# Patient Record
Sex: Female | Born: 1960
Health system: Southern US, Community
[De-identification: ages and names within clinical notes are randomized; demographics above are authoritative.]

## PROBLEM LIST (undated history)

## (undated) DIAGNOSIS — E119 Type 2 diabetes mellitus without complications: Secondary | ICD-10-CM

## (undated) DIAGNOSIS — R7303 Prediabetes: Secondary | ICD-10-CM

## (undated) HISTORY — DX: Type 2 diabetes mellitus without complications: E11.9

---

## 2005-06-19 ENCOUNTER — Ambulatory Visit: Payer: Self-pay

## 2005-06-28 ENCOUNTER — Ambulatory Visit: Payer: Self-pay

## 2005-12-16 ENCOUNTER — Ambulatory Visit: Payer: Self-pay

## 2006-10-22 ENCOUNTER — Ambulatory Visit: Payer: Self-pay

## 2008-01-26 ENCOUNTER — Ambulatory Visit: Payer: Self-pay

## 2008-01-29 ENCOUNTER — Ambulatory Visit: Payer: Self-pay

## 2009-07-25 ENCOUNTER — Ambulatory Visit: Payer: Self-pay

## 2011-03-06 ENCOUNTER — Ambulatory Visit: Payer: Self-pay

## 2012-04-21 ENCOUNTER — Ambulatory Visit: Payer: Self-pay

## 2018-08-14 ENCOUNTER — Encounter (HOSPITAL_COMMUNITY): Payer: Self-pay | Admitting: Emergency Medicine

## 2018-08-14 ENCOUNTER — Emergency Department (HOSPITAL_COMMUNITY)
Admission: EM | Admit: 2018-08-14 | Discharge: 2018-08-15 | Disposition: A | Discharge status: Home / Self Care | Payer: Self-pay | Attending: Emergency Medicine | Admitting: Emergency Medicine

## 2018-08-14 ENCOUNTER — Other Ambulatory Visit: Payer: Self-pay

## 2018-08-14 ENCOUNTER — Emergency Department (HOSPITAL_COMMUNITY): Payer: Self-pay

## 2018-08-14 DIAGNOSIS — N3000 Acute cystitis without hematuria: Secondary | ICD-10-CM | POA: Insufficient documentation

## 2018-08-14 DIAGNOSIS — E1165 Type 2 diabetes mellitus with hyperglycemia: Secondary | ICD-10-CM | POA: Insufficient documentation

## 2018-08-14 DIAGNOSIS — Z79899 Other long term (current) drug therapy: Secondary | ICD-10-CM | POA: Insufficient documentation

## 2018-08-14 DIAGNOSIS — Z7984 Long term (current) use of oral hypoglycemic drugs: Secondary | ICD-10-CM | POA: Insufficient documentation

## 2018-08-14 HISTORY — DX: Prediabetes: R73.03

## 2018-08-14 LAB — BASIC METABOLIC PANEL
Anion gap: 14 (ref 5–15)
BUN: 13 mg/dL (ref 6–20)
CALCIUM: 9.2 mg/dL (ref 8.9–10.3)
CO2: 30 mmol/L (ref 22–32)
Chloride: 84 mmol/L — ABNORMAL LOW (ref 98–111)
Creatinine, Ser: 0.78 mg/dL (ref 0.44–1.00)
GFR calc Af Amer: >60 mL/min (ref >60)
Glucose, Bld: 466 mg/dL — ABNORMAL HIGH (ref 70–99)
Potassium: 4.8 mmol/L (ref 3.5–5.1)
Sodium: 128 mmol/L — ABNORMAL LOW (ref 135–145)

## 2018-08-14 LAB — CBC
HCT: 39.2 % (ref 36.0–46.0)
Hemoglobin: 12.5 g/dL (ref 12.0–15.0)
MCH: 26.7 pg (ref 26.0–34.0)
MCHC: 31.9 g/dL (ref 30.0–36.0)
MCV: 83.8 fL (ref 80.0–100.0)
PLATELETS: 338 10*3/uL (ref 150–400)
RBC: 4.68 MIL/uL (ref 3.87–5.11)
RDW: 13 % (ref 11.5–15.5)
WBC: 16.2 10*3/uL — ABNORMAL HIGH (ref 4.0–10.5)
nRBC: 0 % (ref 0.0–0.2)

## 2018-08-14 LAB — CBG MONITORING, ED
GLUCOSE-CAPILLARY: 456 mg/dL — AB (ref 70–99)
Glucose-Capillary: 395 mg/dL — ABNORMAL HIGH (ref 70–99)

## 2018-08-14 LAB — I-STAT VENOUS BLOOD GAS, ED
ACID-BASE EXCESS: 9 mmol/L — AB (ref 0.0–2.0)
Bicarbonate: 34.5 mmol/L — ABNORMAL HIGH (ref 20.0–28.0)
O2 Saturation: 47 %
TCO2: 36 mmol/L — ABNORMAL HIGH (ref 22–32)
pCO2, Ven: 49 mmHg (ref 44.0–60.0)
pH, Ven: 7.456 — ABNORMAL HIGH (ref 7.250–7.430)
pO2, Ven: 25 mmHg — CL (ref 32.0–45.0)

## 2018-08-14 LAB — LIPASE, BLOOD: Lipase: 51 U/L (ref 11–51)

## 2018-08-14 LAB — MAGNESIUM: Magnesium: 1.9 mg/dL (ref 1.7–2.4)

## 2018-08-14 LAB — HEPATIC FUNCTION PANEL
ALT: 19 U/L (ref 0–44)
AST: 12 U/L — ABNORMAL LOW (ref 15–41)
Albumin: 3 g/dL — ABNORMAL LOW (ref 3.5–5.0)
Alkaline Phosphatase: 121 U/L (ref 38–126)
BILIRUBIN DIRECT: 0.1 mg/dL (ref 0.0–0.2)
Indirect Bilirubin: 0.4 mg/dL (ref 0.3–0.9)
Total Bilirubin: 0.5 mg/dL (ref 0.3–1.2)
Total Protein: 7.5 g/dL (ref 6.5–8.1)

## 2018-08-14 MED ORDER — CEPHALEXIN 500 MG PO CAPS: 500.0000 mg | ORAL_CAPSULE | Freq: Four times a day (QID) | ORAL | 0 refills | Status: AC

## 2018-08-14 MED ORDER — LACTATED RINGERS IV BOLUS
1000.0000 mL | Freq: Once | INTRAVENOUS | Status: AC
  Administered 2018-08-14: 1000 mL via INTRAVENOUS

## 2018-08-14 NOTE — ED Notes (Signed)
Nurse will collect labs. 

## 2018-08-14 NOTE — ED Triage Notes (Signed)
Pt being sent from doctor's office for low K of 2.8, increased WBC count and CBG reading HI. Hx of pre-diabetes. Pt denies any sx or pain.

## 2018-08-14 NOTE — ED Provider Notes (Signed)
[facility] EMERGENCY DEPARTMENT Provider Note   CSN: [id number] Arrival date & time: 08/14/18  1715     History   Chief Complaint Chief Complaint  Patient presents with  . Abnormal Lab  . Hyperglycemia    HPI Nancy Stephenson is a 57 y.o. female.  The history is provided by the patient. The history is limited by a language barrier. A language interpreter was used.   Patient is a 57 year old female with a past medical history of diabetes who presents after being referred to the ED from her medical clinic for further evaluation and management of hyperglycemia and positive nitrates seen on the urine dipstick.  Patient states that she has had some chills and fatigue over the last several days associated with dysuria but denies any fevers, headache, chest pain, shortness of breath, abdominal pain, vomiting, diarrhea, blood in urine, blood in her stool, rash, extremity pain, or other acute complaints.  She notes she has been taking her glipizide and metformin as prescribed by her clinic.  Denies sick contacts or recent travel out of [place].  Denies history of recurrent urinary tract infections.  Denies alleviating or aggravating factors.  Past Medical History:  Diagnosis Date  . Pre-diabetes     There are no active problems to display for this patient.   Past Surgical History:  Procedure Laterality Date  . CESAREAN SECTION       OB History   No obstetric history on file.      Home Medications    Prior to Admission medications   Medication Sig Start Date End Date Taking? Authorizing Provider  diclofenac (VOLTAREN) 50 MG EC tablet Take 50 mg by mouth 2 (two) times daily. 07/31/18 08/15/18 Yes [provider]  glipiZIDE (GLUCOTROL) 5 MG tablet Take 5 mg by mouth 2 (two) times daily before a meal. 08/01/18  Yes [provider]  ibuprofen (ADVIL,MOTRIN) 200 MG tablet Take 200 mg by mouth every 6 (six) hours as needed (for pain or  headaches).   Yes [provider]  metFORMIN (GLUCOPHAGE) 1000 MG tablet Take 1,000 mg by mouth 2 (two) times daily after a meal. 08/03/18  Yes [provider]  cephALEXin (KEFLEX) 500 MG capsule Take 1 capsule (500 mg total) by mouth 4 (four) times daily for 7 days. 08/14/18 08/21/18  Hulan Saas, MD    Family History No family history on file.  Social History Social History   Tobacco Use  . Smoking status: Never Smoker  . Smokeless tobacco: Never Used  Substance Use Topics  . Alcohol use: Never    Frequency: Never  . Drug use: Never     Allergies   Fish allergy and Shellfish-derived products   Review of Systems Review of Systems  Constitutional: Positive for chills and fatigue. Negative for fever.  HENT: Negative for ear pain and sore throat.   Eyes: Negative for pain and visual disturbance.  Respiratory: Negative for cough and shortness of breath.   Cardiovascular: Negative for chest pain and palpitations.  Gastrointestinal: Negative for abdominal pain and vomiting.  Genitourinary: Positive for dysuria. Negative for hematuria.  Musculoskeletal: Negative for arthralgias and back pain.  Skin: Negative for color change and rash.  Neurological: Negative for seizures and syncope.  All other systems reviewed and are negative.    Physical Exam Updated Vital Signs BP 120/81   Pulse 89   Temp 98.1 F (36.7 C) (Oral)   Resp 19   Ht 4' 10"  (  1.473 m)   Wt 52.2 kg   SpO2 96%   BMI 24.04 kg/m   Physical Exam Vitals signs and nursing note reviewed.  Constitutional:      General: She is not in acute distress.    Appearance: Normal appearance. She is well-developed and normal weight.  HENT:     Head: Normocephalic and atraumatic.     Right Ear: External ear normal.     Left Ear: External ear normal.     Nose: Nose normal.     Mouth/Throat:     Mouth: Mucous membranes are moist.  Eyes:     Conjunctiva/sclera: Conjunctivae normal.     Pupils:  Pupils are equal, round, and reactive to light.  Neck:     Musculoskeletal: Neck supple.  Cardiovascular:     Rate and Rhythm: Normal rate and regular rhythm.     Heart sounds: No murmur.  Pulmonary:     Effort: Pulmonary effort is normal. No respiratory distress.     Breath sounds: Normal breath sounds.  Abdominal:     Palpations: Abdomen is soft.     Tenderness: There is no abdominal tenderness.  Skin:    General: Skin is warm and dry.  Neurological:     Mental Status: She is alert.      ED Treatments / Results  Labs (all labs ordered are listed, but only abnormal results are displayed) Labs Reviewed  BASIC METABOLIC PANEL - Abnormal; Notable for the following components:      Result Value   Sodium 128 (*)    Chloride 84 (*)    Glucose, Bld 466 (*)    All other components within normal limits  CBC - Abnormal; Notable for the following components:   WBC 16.2 (*)    All other components within normal limits  HEPATIC FUNCTION PANEL - Abnormal; Notable for the following components:   Albumin 3.0 (*)    AST 12 (*)    All other components within normal limits  CBG MONITORING, ED - Abnormal; Notable for the following components:   Glucose-Capillary 456 (*)    All other components within normal limits  CBG MONITORING, ED - Abnormal; Notable for the following components:   Glucose-Capillary 395 (*)    All other components within normal limits  I-STAT VENOUS BLOOD GAS, ED - Abnormal; Notable for the following components:   pH, Ven 7.456 (*)    pO2, Ven 25.0 (*)    Bicarbonate 34.5 (*)    TCO2 36 (*)    Acid-Base Excess 9.0 (*)    All other components within normal limits  URINE CULTURE  MAGNESIUM  LIPASE, BLOOD  URINALYSIS, ROUTINE W REFLEX MICROSCOPIC  BLOOD GAS, VENOUS    EKG EKG Interpretation  Date/Time:  Friday August 14 2018 22:29:06 EST Ventricular Rate:  89 PR Interval:    QRS Duration: 79 QT Interval:  369 QTC Calculation: 449 R Axis:   24 Text  Interpretation:  Sinus rhythm Ventricular premature complex No old tracing to compare Confirmed by Isla Pence [phone]) on 08/14/2018 10:31:57 PM   Radiology Dg Chest 2 View  Result Date: 08/14/2018 CLINICAL DATA:  Tachycardia EXAM: CHEST - 2 VIEW COMPARISON:  None. FINDINGS: The heart size and mediastinal contours are within normal limits. Both lungs are clear. The visualized skeletal structures are unremarkable. IMPRESSION: No active cardiopulmonary disease. Electronically Signed   By: Donavan Foil M.D.   On: 08/14/2018 22:44    Procedures Procedures (including critical care time)  Medications Ordered in ED Medications  lactated ringers bolus 1,000 mL (0 mLs Intravenous Stopped 08/14/18 2241)     Initial Impression / Assessment and Plan / ED Course  I have reviewed the triage vital signs and the nursing notes.  Pertinent labs & imaging results that were available during my care of the patient were reviewed by me and considered in my medical decision making (see chart for details).     Patient is a 57 year old female who presents with above-stated history and exam.  On presentation patient is tachycardic to 108 with otherwise stable vital signs.  Exam as above remarkable for no abdominal tenderness, no CVA tenderness, and otherwise unremarkable exam.  While patient is noted to be hyperglycemic to 466 I have a low suspicion for DKA given bicarb of 30 and anion gap of 14.  In addition VBG shows a pH of 7.45 with a PCO2 of 49, and bicarb of 34.5.  CBC remarkable for WBC 16.2, hemoglobin 12.5, platelets 338.  Doubt hepatobiliary etiology given patient has no tenderness in right upper quadrant and AST/ALT/alk phos are WNL.  Chest x-ray and EKG obtained due to tachycardia.  Chest x-ray does not suggest any pneumonia, pneumothorax, pulmonary edema, pleural effusion, or other acute intrathoracic abnormality.  EKG shows NSR with ventricular rate of 89, normal intervals, no signs of acute  ischemic change.  I have low suspicion for ACS or PE.  In addition history exam is not consistent with aortic dissection, pyelonephritis, appendicitis, diverticulitis, apparent torsion, PID, or other acute life-threatening intra-abdominal etiology.  Given review of medical record shows the patient had a urine dipstick that was positive for nitrites concern for UTI.Given hyperglycemia and tachycardia there is concern for dehydration.  Patient given IV fluid bolus in the ED. patient's tachycardia resolved after IV fluid bolus.  Doubt acute pancreatitis given lipase of 51.  Patient discharged in stable condition.  Return precautions advised and discussed.  Patient given a prescription to treat her UTI instructed follow-up with her PCP in 2 to 3 days.  Final Clinical Impressions(s) / ED Diagnoses   Final diagnoses:  Acute cystitis without hematuria    ED Discharge Orders         Ordered    cephALEXin (KEFLEX) 500 MG capsule  4 times daily     08/14/18 2349           Hulan Saas, MD 08/15/18 0000    Isla Pence, MD 08/15/18 [phone]

## 2018-08-14 NOTE — ED Notes (Signed)
Patient transported to X-ray 

## 2018-08-15 NOTE — ED Notes (Signed)
Reviewed d/c instructions with pt, who verbalized understanding and had no outstanding questions. Pt departed in NAD, refused use of wheelchair.   

## 2018-09-18 ENCOUNTER — Inpatient Hospital Stay (HOSPITAL_COMMUNITY)
Admission: EM | Admit: 2018-09-18 | Discharge: 2018-09-22 | DRG: 638 | Disposition: A | Discharge status: Home / Self Care | Payer: Self-pay | Attending: Internal Medicine | Admitting: Internal Medicine

## 2018-09-18 ENCOUNTER — Emergency Department (HOSPITAL_COMMUNITY): Payer: Self-pay

## 2018-09-18 ENCOUNTER — Encounter: Payer: Self-pay | Admitting: Family Medicine

## 2018-09-18 ENCOUNTER — Ambulatory Visit: Payer: Self-pay | Attending: Family Medicine | Admitting: Family Medicine

## 2018-09-18 VITALS — BP 145/85 | HR 82 | Temp 98.5°F | Resp 18 | Ht 60.0 in | Wt 110.0 lb

## 2018-09-18 DIAGNOSIS — Z8639 Personal history of other endocrine, nutritional and metabolic disease: Secondary | ICD-10-CM

## 2018-09-18 DIAGNOSIS — E1142 Type 2 diabetes mellitus with diabetic polyneuropathy: Secondary | ICD-10-CM | POA: Insufficient documentation

## 2018-09-18 DIAGNOSIS — R52 Pain, unspecified: Secondary | ICD-10-CM

## 2018-09-18 DIAGNOSIS — B9620 Unspecified Escherichia coli [E. coli] as the cause of diseases classified elsewhere: Secondary | ICD-10-CM | POA: Diagnosis present

## 2018-09-18 DIAGNOSIS — Z8249 Family history of ischemic heart disease and other diseases of the circulatory system: Secondary | ICD-10-CM

## 2018-09-18 DIAGNOSIS — E1165 Type 2 diabetes mellitus with hyperglycemia: Secondary | ICD-10-CM

## 2018-09-18 DIAGNOSIS — Z794 Long term (current) use of insulin: Secondary | ICD-10-CM | POA: Insufficient documentation

## 2018-09-18 DIAGNOSIS — E1110 Type 2 diabetes mellitus with ketoacidosis without coma: Secondary | ICD-10-CM

## 2018-09-18 DIAGNOSIS — N12 Tubulo-interstitial nephritis, not specified as acute or chronic: Secondary | ICD-10-CM | POA: Diagnosis present

## 2018-09-18 DIAGNOSIS — Z1612 Extended spectrum beta lactamase (ESBL) resistance: Secondary | ICD-10-CM | POA: Diagnosis present

## 2018-09-18 DIAGNOSIS — N3001 Acute cystitis with hematuria: Secondary | ICD-10-CM

## 2018-09-18 LAB — POCT URINALYSIS DIP (CLINITEK)
Bilirubin, UA: NEGATIVE
Glucose, UA: 500 mg/dL — AB
Ketones, POC UA: NEGATIVE mg/dL
Nitrite, UA: NEGATIVE
POC PROTEIN,UA: NEGATIVE
Spec Grav, UA: <=1.005 — AB
Urobilinogen, UA: 0.2 U/dL
pH, UA: 6

## 2018-09-18 LAB — URINALYSIS, ROUTINE W REFLEX MICROSCOPIC
Bilirubin Urine: NEGATIVE
Glucose, UA: >=500 mg/dL — AB
Ketones, ur: NEGATIVE mg/dL
Nitrite: NEGATIVE
Protein, ur: NEGATIVE mg/dL
Specific Gravity, Urine: 1.023 (ref 1.005–1.030)
pH: 6 (ref 5.0–8.0)

## 2018-09-18 LAB — BASIC METABOLIC PANEL
Anion gap: 16 — ABNORMAL HIGH (ref 5–15)
BUN: 17 mg/dL (ref 6–20)
CHLORIDE: 87 mmol/L — AB (ref 98–111)
CO2: 25 mmol/L (ref 22–32)
Calcium: 9.2 mg/dL (ref 8.9–10.3)
Creatinine, Ser: 0.7 mg/dL (ref 0.44–1.00)
GFR calc Af Amer: >60 mL/min (ref >60)
GFR calc non Af Amer: >60 mL/min (ref >60)
Glucose, Bld: 603 mg/dL (ref 70–99)
Potassium: 4.3 mmol/L (ref 3.5–5.1)
Sodium: 128 mmol/L — ABNORMAL LOW (ref 135–145)

## 2018-09-18 LAB — POCT GLYCOSYLATED HEMOGLOBIN (HGB A1C): Hemoglobin A1C: 14.9 % — AB (ref 4.0–5.6)

## 2018-09-18 LAB — CBC
HEMATOCRIT: 37.2 % (ref 36.0–46.0)
Hemoglobin: 11.7 g/dL — ABNORMAL LOW (ref 12.0–15.0)
MCH: 26.2 pg (ref 26.0–34.0)
MCHC: 31.5 g/dL (ref 30.0–36.0)
MCV: 83.4 fL (ref 80.0–100.0)
Platelets: 421 10*3/uL — ABNORMAL HIGH (ref 150–400)
RBC: 4.46 MIL/uL (ref 3.87–5.11)
RDW: 13.4 % (ref 11.5–15.5)
WBC: 13.2 10*3/uL — ABNORMAL HIGH (ref 4.0–10.5)
nRBC: 0 % (ref 0.0–0.2)

## 2018-09-18 LAB — CBG MONITORING, ED
GLUCOSE-CAPILLARY: 276 mg/dL — AB (ref 70–99)
Glucose-Capillary: 274 mg/dL — ABNORMAL HIGH (ref 70–99)
Glucose-Capillary: 388 mg/dL — ABNORMAL HIGH (ref 70–99)
Glucose-Capillary: 503 mg/dL (ref 70–99)

## 2018-09-18 LAB — I-STAT BETA HCG BLOOD, ED (MC, WL, AP ONLY): I-stat hCG, quantitative: <5 m[IU]/mL (ref <5)

## 2018-09-18 LAB — GLUCOSE, POCT (MANUAL RESULT ENTRY): POC Glucose: >600 mg/dL (ref 70–99)

## 2018-09-18 MED ORDER — GLUCOSE BLOOD VI STRP: ORAL_STRIP | 12 refills | Status: DC

## 2018-09-18 MED ORDER — SODIUM CHLORIDE 0.9 % IV SOLN
1.0000 g | Freq: Once | INTRAVENOUS | Status: AC
  Administered 2018-09-18: 1 g via INTRAVENOUS
  Filled 2018-09-18: qty 10

## 2018-09-18 MED ORDER — SODIUM CHLORIDE 0.9 % IV BOLUS
1000.0000 mL | Freq: Once | INTRAVENOUS | Status: AC
  Administered 2018-09-18: 1000 mL via INTRAVENOUS

## 2018-09-18 MED ORDER — CIPROFLOXACIN HCL 500 MG PO TABS: 500.0000 mg | ORAL_TABLET | Freq: Two times a day (BID) | ORAL | 0 refills | Status: DC

## 2018-09-18 MED ORDER — INSULIN REGULAR(HUMAN) IN NACL 100-0.9 UT/100ML-% IV SOLN
INTRAVENOUS | Status: DC
  Administered 2018-09-18: 3.3 [IU]/h via INTRAVENOUS
  Filled 2018-09-18: qty 100

## 2018-09-18 MED ORDER — GLIPIZIDE 5 MG PO TABS: 5.0000 mg | ORAL_TABLET | Freq: Two times a day (BID) | ORAL | 0 refills | Status: DC

## 2018-09-18 MED ORDER — MORPHINE SULFATE (PF) 4 MG/ML IV SOLN
4.0000 mg | Freq: Once | INTRAVENOUS | Status: DC
  Filled 2018-09-18: qty 1

## 2018-09-18 MED ORDER — SODIUM CHLORIDE 0.9 % IV SOLN
INTRAVENOUS | Status: DC
  Administered 2018-09-18: 21:00:00 via INTRAVENOUS

## 2018-09-18 MED ORDER — TRUEPLUS LANCETS 28G MISC: 11 refills | Status: DC

## 2018-09-18 MED ORDER — METFORMIN HCL 1000 MG PO TABS: 1000.0000 mg | ORAL_TABLET | Freq: Two times a day (BID) | ORAL | 3 refills | Status: DC

## 2018-09-18 MED ORDER — DEXTROSE-NACL 5-0.45 % IV SOLN: INTRAVENOUS | Status: DC

## 2018-09-18 MED ORDER — IOHEXOL 300 MG/ML  SOLN
100.0000 mL | Freq: Once | INTRAMUSCULAR | Status: AC | PRN
  Administered 2018-09-18: 100 mL via INTRAVENOUS

## 2018-09-18 MED ORDER — TRUE METRIX AIR GLUCOSE METER W/DEVICE KIT: 1.0000 | PACK | Freq: Three times a day (TID) | 0 refills | Status: AC

## 2018-09-18 MED FILL — TRUEplus LANCETS 28G MISC: 30 days supply | Qty: 100 | Fill #0

## 2018-09-18 MED FILL — TRUE METRIX TEST STRIP: 25 days supply | Qty: 100 | Fill #0

## 2018-09-18 MED FILL — !TRUE METRIX BLOOD GLUCOSE: 1 days supply | Qty: 1 | Fill #0

## 2018-09-18 NOTE — Patient Instructions (Addendum)
Please go to the Emergency Department today in follow-up of your dangerously high blood sugar level. A prescription was sent to this pharmacy for you to obtain a glucometer and supplies to check your blood sugars once you have been released from the ED/Hospital. I will refill your current medications to [facility] along with an antibiotic for your urinary tract infection however, you may be prescribed different medications for your blood sugar/diabetes at the hospital. Please return for an appointment here next week.  Hiperglucemia Hyperglycemia La hiperglucemia se produce cuando el nivel de azcar (glucosa) en la sangre es demasiado alto. Puede suceder que no cause sntomas. Si tiene sntomas, estos pueden incluir seales de advertencia, por ejemplo, las siguientes:  Estar ms sediento que lo habitual.  Hambre.  Cansancio.  Necesidad de Geographical information systems officer con mayor frecuencia que lo normal.  Visin borrosa. Si la afeccin empeora, puede presentar otros sntomas, como los siguientes:  [place].  Falta de hambre (inapetencia).  Aliento con [employer] a fruta.  Debilidad.  Aumento o prdida de peso no planificados. Probablemente pierda de peso muy rpidamente.  Hormigueo o adormecimiento en las manos o los pies.  Dolor de [place].  Cuando se pellizca la piel, esta no vuelve rpidamente a su lugar al soltarla (turgencia cutnea deficiente).  Dolor en el vientre (abdomen).  Cortes o moretones que tardan en curarse. La hiperglucemia puede presentarse tanto en personas que tienen diabetes como en las que no la tienen. Puede desarrollarse despacio o rpidamente y ser una emergencia mdica. Siga estas indicaciones en su casa: Instrucciones generales  [employer] de venta libre y los recetados solamente como se lo haya indicado el mdico.  No consuma productos que contengan nicotina o tabaco, como cigarrillos y Administrator, Civil Service. Si necesita ayuda para dejar de fumar, consulte al  mdico.  Limite el consumo de alcohol a no ms de 1 medida por da si es mujer y no est Orthoptist, y a 2 medidas si es hombre. Una medida equivale a 12oz ( ) de cerveza, 5oz ( ) de vino o 1oz (5ml) de bebidas alcohlicas de alta graduacin.  Controle el estrs. Si necesita ayuda para lograrlo, consulte al mdico.  Concurra a todas las visitas de control como se lo haya indicado el mdico. Esto es importante. Comida y bebida   Mantenga un peso saludable.  Haga ejercicios con regularidad como se lo haya indicado el mdico.  Beba la cantidad suficiente de lquido, especialmente si: ? Realiza actividad fsica. ? Se enferma. ? El clima es caluroso.  Consuma alimentos saludables, por ejemplo: ? Protenas con bajo contenido de grasa [place]). ? Carbohidratos complejos, como panes integrales o arroz integral. ? Nils Pyle y verduras frescas. ? Productos lcteos descremados. ? Grasas saludables.  Beba suficiente lquido como para mantener el pis (orina) claro o de color amarillo plido. Si usted tiene diabetes:   Asegrese de Public house manager de la hiperglucemia.  Siga el plan de control de la diabetes como se lo haya indicado el mdico. Haga lo siguiente: ? Aplquese la insulina y tome los medicamentos como se lo hayan indicado. ? Siga el plan de ejercicio. ? Siga el plan de comidas. Coma a horario. No se saltee comidas. ? Contrlese el nivel de azcar en la sangre con la frecuencia que le hayan indicado. Contrlese antes y despus de hacer actividad fsica. Si hace ejercicio durante ms tiempo o de [employer] de lo habitual, asegrese de Chief Operating Officer su nivel de azcar en la sangre con mayor frecuencia. ? Siga el  plan para los [address] de enfermedad cuando no pueda comer o beber normalmente. Arme este plan de antemano con el mdico.  Comparta su plan de control de la diabetes con sus compaeros de trabajo y de la escuela, y con las otras personas que viven en su  hogar.  Contrlese las cetonas en la orina cuando est enfermo y [place] se lo haya indicado el mdico.  Lleve consigo una tarjeta, o use un brazalete o una medalla que indiquen que es diabtico. Comunquese con un mdico si:  El nivel de azcar en la sangre est por encima de 240mg /dl (47,8GNFA/O) durante 2das seguidos.  Tiene problemas para Futures trader de azcar en la sangre dentro del intervalo deseado.  El nivel de azcar en la sangre le aumenta a menudo. Solicite ayuda de inmediato si:  Tiene dificultad para respirar.  Tiene cambios en la manera de sentirse, pensar o actuar (estado mental).  Tiene ganas constantes de vomitar (nuseas).  No puede dejar de vomitar. Estos sntomas pueden Customer service manager. No espere hasta que los sntomas desaparezcan. Solicite atencin mdica de inmediato. Comunquese con el servicio de emergencias de su localidad (911 en los [place]). No conduzca por sus propios medios [employer] hospital. Resumen  La hiperglucemia se produce cuando el nivel de azcar (glucosa) en la sangre es demasiado alto.  La hiperglucemia puede presentarse tanto en personas que tienen diabetes como en las que no la tienen.  Asegrese de beber la cantidad suficiente de lquido, de comer alimentos saludables y de hacer actividad fsica con regularidad.  Comunquese con el mdico si tiene problemas para Futures trader de azcar en la sangre dentro del intervalo deseado. Esta informacin no tiene Theme park manager el consejo del mdico. Asegrese de hacerle al mdico cualquier pregunta que tenga. Document Released: 09/14/2010 Document Revised: 04/08/2017 Document Reviewed: 04/18/2015 Elsevier Interactive Patient Education  2019 [employer].

## 2018-09-18 NOTE — ED Notes (Signed)
Patient transported to [place] 

## 2018-09-18 NOTE — ED Provider Notes (Signed)
[facility] EMERGENCY DEPARTMENT Provider Note   CSN: [id number] Arrival date & time: 09/18/18  1711     History   Chief Complaint Chief Complaint  Patient presents with  . Hyperglycemia    HPI Nancy Stephenson is a 58 y.o. female.  The history is provided by the patient and medical records. The history is limited by a language barrier. A language interpreter was used.  Hyperglycemia     58 year old non-insulin-dependent diabetic female presenting for evaluation of abdominal pain and hyperglycemia.  Patient is Spanish-speaking, history obtained through language interpreter.  Patient reports she was recently diagnosed with diabetes and is currently on metformin and glyburide.  A month ago she was seen by her PCP for abdominal pain and dysuria.  She was diagnosed with having a urinary tract infection and high blood sugar.  She was prescribed Keflex which she took for 7 days.  Her symptoms did improve but for the past several days it has become more uncomfortable.  She complained of burning sensation when urinating with urinary urgency and frequency.  She further complaining of persistent weight loss despite been compliant with her medication.  She does endorse some mild nausea without vomiting.  Complaining of lower abdominal cramping that is moderate in severity and nonradiating.  She does not complain of any fever or chills no headache lightheadedness or dizziness no chest pain or shortness of breath no constipation or diarrhea or rash.  She went to her PCP today with her symptoms but was recommended to come to ER for further care.  Past Medical History:  Diagnosis Date  . Diabetes mellitus without complication ([place])   . Pre-diabetes     Patient Active Problem List   Diagnosis Date Noted  . Type 2 diabetes mellitus with hyperglycemia, without long-term current use of insulin ([place]) 09/18/2018    Past Surgical History:  Procedure Laterality Date  . CESAREAN  SECTION       OB History   No obstetric history on file.      Home Medications    Prior to Admission medications   Medication Sig Start Date End Date Taking? Authorizing Provider  Blood Glucose Monitoring Suppl (TRUE METRIX AIR GLUCOSE METER) w/Device KIT 1 kit by Does not apply route 4 (four) times daily -  before meals and at bedtime. 09/18/18   Fulp, Cammie, MD  ciprofloxacin (CIPRO) 500 MG tablet Take 1 tablet (500 mg total) by mouth 2 (two) times daily. 09/18/18   Fulp, Cammie, MD  glipiZIDE (GLUCOTROL) 5 MG tablet Take 1 tablet (5 mg total) by mouth 2 (two) times daily before a meal. 09/18/18   Fulp, Cammie, MD  glucose blood (TRUE METRIX BLOOD GLUCOSE TEST) test strip Use as instructed to check morning blood sugar before eating, 2 hours after largest meal of the day and before bedtime 09/18/18   Fulp, Cammie, MD  ibuprofen (ADVIL,MOTRIN) 200 MG tablet Take 200 mg by mouth every 6 (six) hours as needed (for pain or headaches).    [provider]  metFORMIN (GLUCOPHAGE) 1000 MG tablet Take 1 tablet (1,000 mg total) by mouth 2 (two) times daily after a meal. 09/18/18   Fulp, Cammie, MD  TRUEPLUS LANCETS 28G MISC Use to check blood sugar 3 times per day 09/18/18   Antony Blackbird, MD    Family History Family History  Problem Relation Age of Onset  . Hypertension Sister   . Hypertension Brother     Social History Social History  Tobacco Use  . Smoking status: Never Smoker  . Smokeless tobacco: Never Used  Substance Use Topics  . Alcohol use: Never    Frequency: Never  . Drug use: Never     Allergies   Fish allergy and Shellfish-derived products   Review of Systems Review of Systems  All other systems reviewed and are negative.    Physical Exam Updated Vital Signs BP (!) 158/99 (BP Location: Right Arm)   Pulse 85   Temp 98 F (36.7 C) (Oral)   Resp 18   SpO2 97%   Physical Exam Vitals signs and nursing note reviewed.  Constitutional:      General: She  is not in acute distress.    Appearance: She is well-developed.  HENT:     Head: Atraumatic.  Eyes:     Conjunctiva/sclera: Conjunctivae normal.  Neck:     Musculoskeletal: Neck supple.  Cardiovascular:     Rate and Rhythm: Normal rate and regular rhythm.  Pulmonary:     Effort: Pulmonary effort is normal.     Breath sounds: Normal breath sounds.  Abdominal:     Palpations: Abdomen is soft.     Tenderness: There is abdominal tenderness (Tenderness to suprapubic region without guarding or rebound tenderness.).  Genitourinary:    Comments: No CVA tenderness Skin:    Findings: No rash.  Neurological:     Mental Status: She is alert.      ED Treatments / Results  Labs (all labs ordered are listed, but only abnormal results are displayed) Labs Reviewed  BASIC METABOLIC PANEL - Abnormal; Notable for the following components:      Result Value   Sodium 128 (*)    Chloride 87 (*)    Glucose, Bld 603 (*)    Anion gap 16 (*)    All other components within normal limits  CBC - Abnormal; Notable for the following components:   WBC 13.2 (*)    Hemoglobin 11.7 (*)    Platelets 421 (*)    All other components within normal limits  URINALYSIS, ROUTINE W REFLEX MICROSCOPIC - Abnormal; Notable for the following components:   Color, Urine STRAW (*)    APPearance HAZY (*)    Glucose, UA >=500 (*)    Hgb urine dipstick SMALL (*)    Leukocytes, UA MODERATE (*)    Bacteria, UA RARE (*)    All other components within normal limits  CBG MONITORING, ED - Abnormal; Notable for the following components:   Glucose-Capillary 503 (*)    All other components within normal limits  CBG MONITORING, ED - Abnormal; Notable for the following components:   Glucose-Capillary 388 (*)    All other components within normal limits  URINE CULTURE  I-STAT BETA HCG BLOOD, ED ([facility], [facility], [facility] ONLY)    EKG None  Radiology Ct Abdomen Pelvis W Contrast  Result Date: 09/18/2018 CLINICAL DATA:  Unspecified  abdominal pain, question pyelonephritis EXAM: CT ABDOMEN AND PELVIS WITH CONTRAST TECHNIQUE: Multidetector CT imaging of the abdomen and pelvis was performed using the standard protocol following bolus administration of intravenous contrast. CONTRAST:  153m OMNIPAQUE IOHEXOL 300 MG/ML  SOLN COMPARISON:  None. FINDINGS: Lower chest: Top-normal heart size. Clear lung bases. Hepatobiliary: Steatosis of the liver without space-occupying mass. Mild fatty infiltration adjacent to the falciform. Nondistended gallbladder free of stones. No biliary dilatation. Pancreas: Normal Spleen: Normal Adrenals/Urinary Tract: Abnormal enhancement of the right kidney with in homogeneous slightly striated appearance compatible with right-sided pyelonephritis. Extrarenal  pelvis is noted on left with normal enhancement of the left kidney. Punctate upper pole renal calculus is seen on the left. No hydroureteronephrosis. Urothelial enhancement the right ureter compatible with urinary tract infection. The urinary bladder is unremarkable. Stomach/Bowel: Decompressed stomach. Normal small bowel rotation. Mild fluid-filled distention without mechanical obstruction involving distal jejunum and ileum. Findings may represent mild small bowel enteritis, dysmotility or ileus among some considerations. Moderate stool retention is seen within the right colon. Normal appendix is identified. Vascular/Lymphatic: Aortic atherosclerosis. No enlarged abdominal or pelvic lymph nodes. Reproductive: Uterus and bilateral adnexa are unremarkable. Other: No abdominal wall hernia or abnormality. No abdominopelvic ascites. Musculoskeletal: Degenerative disc disease L5-S1. No acute nor suspicious osseous abnormalities. IMPRESSION: 1. Findings in keeping with right-sided pyelonephritis. Urothelial enhancement of the right ureter is noted compatible with urinary tract infection as well. There are more confluent areas of hypodensity involving the right kidney in both  upper and lower pole as well as interpolar aspect. A developing lobar nephronia is not excluded. 2. Mild fluid-filled distention of small bowel without mechanical obstruction. Findings may represent a mild small bowel enteritis, dysmotility or ileus among some considerations. 3. Hepatic steatosis. 4. Degenerative disc disease L5-S1. Aortic Atherosclerosis (ICD10-I70.0). Electronically Signed   By: Ashley Royalty M.D.   On: 09/18/2018 21:05    Procedures .Critical Care Performed by: Domenic Moras, PA-C Authorized by: Domenic Moras, PA-C   Critical care provider statement:    Critical care time (minutes):  45   Critical care was time spent personally by me on the following activities:  Discussions with consultants, evaluation of patient's response to treatment, examination of patient, ordering and performing treatments and interventions, ordering and review of laboratory studies, ordering and review of radiographic studies, pulse oximetry, re-evaluation of patient's condition, obtaining history from patient or surrogate and review of old charts   (including critical care time)  Medications Ordered in ED Medications  dextrose 5 %-0.45 % sodium chloride infusion (has no administration in time range)  insulin regular, human (MYXREDLIN) 100 units/ 100 mL infusion (3.3 Units/hr Intravenous New Bag/Given 09/18/18 2149)  sodium chloride 0.9 % bolus 1,000 mL (0 mLs Intravenous Stopped 09/18/18 2127)    And  0.9 %  sodium chloride infusion ( Intravenous New Bag/Given 09/18/18 2128)  morphine 4 MG/ML injection 4 mg (has no administration in time range)  cefTRIAXone (ROCEPHIN) 1 g in sodium chloride 0.9 % 100 mL IVPB (1 g Intravenous New Bag/Given 09/18/18 2129)  iohexol (OMNIPAQUE) 300 MG/ML solution 100 mL (100 mLs Intravenous Contrast Given 09/18/18 2045)     Initial Impression / Assessment and Plan / ED Course  I have reviewed the triage vital signs and the nursing notes.  Pertinent labs & imaging results  that were available during my care of the patient were reviewed by me and considered in my medical decision making (see chart for details).     BP (!) 146/81   Pulse 70   Temp 98 F (36.7 C) (Oral)   Resp 15   SpO2 98%    Final Clinical Impressions(s) / ED Diagnoses   Final diagnoses:  Pyelonephritis of right kidney  Diabetic ketoacidosis without coma associated with type 2 diabetes mellitus [facility])    ED Discharge Orders    None     Patient is complaining of dysuria, suprapubic pain, and uncontrolled CBG.  She is not insulin-dependent  Fortunately patient is nonseptic..  Today, labs shows hyperglycemia with a CBG 603, and an anion gap.  Furthermore,  she does have elevated WBC of 13.2 and a UA consistent with urinary tract infection.  Given potential for pyelonephritis, abdominal pelvis CT scan ordered, IV fluid given as well as initiating glucose stabilizer for better glycemic management.  10:36 PM Patient is currently on a glucose stabilizer to help improve her gap and improve her CBG.  CT scan obtained showing evidence of right pyelonephritis.  With elevated white count of 13, pyelonephritis and patient who failed outpatient treatment for her UTI coupled with concerning for mild DKA, appreciate consultation from triad hospitalist Dr. Posey Pronto who will admit patient for further management.   Domenic Moras, PA-C 09/18/18 2239    Fredia Sorrow, MD 09/20/18 [phone]

## 2018-09-18 NOTE — Progress Notes (Signed)
Patient complains of losing about 20 pounds over the past month and a half and does not know why.

## 2018-09-18 NOTE — H&P (Signed)
History and Physical    Nancy Stephenson LDJ:[id number] DOB: [birth year] DOA: 09/18/2018  PCP: Antony Blackbird, MD  [facility]  I have personally briefly reviewed patient's old medical records in [place]  Chief Complaint: High blood sugar and dysuria  HPI: Nancy Stephenson is a 58 y.o. female with medical history significant for recently diagnosed type 2 diabetes who presents to the ED from her PCP office for hyperglycemia.  Patient is Spanish-speaking therefore communication is via [employer] video remote interpreter.  Further information is obtained from chart review.  [facility] and found to have a elevated point-of-care glucose >600, A1c 14.9, and reported UTI symptoms.  She was sent to the ED for further evaluation and management.  Patient was previously seen in the ED on 08/23/2018 for UTI and treated with Keflex.  She was also noted to be hyperglycemic to 466 without evidence of DKA.  Patient states that she felt well for a period of time after completing antibiotics, but had return of symptoms recently of dysuria, suprapubic discomfort, and intermittent right-sided back pain.  She denies any associated nausea, vomiting, chest pain, palpitations, diaphoresis, lightheadedness or dizziness.  ED Course:  Initial vitals in the ED showed BP 1/99, pulse 85, RR 18, temp 98 Fahrenheit, SPO2 97% on room air.  Labs notable for serum glucose 603, sodium 128 (140 when corrected for hyperglycemia), potassium 4.3, chloride 87, bicarb 25, BUN 17, creatinine 0.7, anion gap 16, WBC 13.2, hemoglobin 11.7, platelets 421, urinalysis showed > 500 glucose, negative ketones, moderate leukocytes, negative nitrites, rare bacteria.  I-STAT beta-hCG is negative.  CT abdomen/pelvis with contrast was obtained which showed finding suggestive of right-sided pyelonephritis.  Urine  culture was collected and patient was started IV ceftriaxone.  She was given 1 L normal saline and started on IV insulin infusion.  The hospital service was consulted admit for further management.   Review of Systems: As per HPI otherwise 10 point review of systems negative.    Past Medical History:  Diagnosis Date  . Diabetes mellitus without complication ([place])   . Pre-diabetes     Past Surgical History:  Procedure Laterality Date  . CESAREAN SECTION       reports that she has never smoked. She has never used smokeless tobacco. She reports that she does not drink alcohol or use drugs.  Allergies  Allergen Reactions  . Fish Allergy Shortness Of Breath, Itching and Swelling  . Shellfish-Derived Products Shortness Of Breath, Itching and Swelling    Family History  Problem Relation Age of Onset  . Hypertension Sister   . Hypertension Brother      Prior to Admission medications   Medication Sig Start Date End Date Taking? Authorizing Provider  glipiZIDE (GLUCOTROL) 5 MG tablet Take 1 tablet (5 mg total) by mouth 2 (two) times daily before a meal. 09/18/18  Yes Fulp, Cammie, MD  ibuprofen (ADVIL,MOTRIN) 200 MG tablet Take 200 mg by mouth every 6 (six) hours as needed (for pain or headaches).   Yes [provider]  metFORMIN (GLUCOPHAGE) 1000 MG tablet Take 1 tablet (1,000 mg total) by mouth 2 (two) times daily after a meal. 09/18/18  Yes Fulp, Cammie, MD  Blood Glucose Monitoring Suppl (TRUE METRIX AIR GLUCOSE METER) w/Device KIT 1 kit by Does not apply route 4 (four) times daily -  before meals and at bedtime. 09/18/18  Fulp, Cammie, MD  ciprofloxacin (CIPRO) 500 MG tablet Take 1 tablet (500 mg total) by mouth 2 (two) times daily. 09/18/18   Fulp, Cammie, MD  glucose blood (TRUE METRIX BLOOD GLUCOSE TEST) test strip Use as instructed to check morning blood sugar before eating, 2 hours after largest meal of the day and before bedtime 09/18/18   Fulp, Cammie, MD  TRUEPLUS  LANCETS 28G MISC Use to check blood sugar 3 times per day 09/18/18   Antony Blackbird, MD    Physical Exam: Vitals:   09/18/18 2330 09/18/18 2345 09/19/18 0015 09/19/18 0030  BP: 132/88 127/70 126/84 121/77  Pulse: 67 68 69 68  Resp: _0 Temp:      TempSrc:      SpO2: 97% 96% 97% 96%    Constitutional: NAD, calm, comfortable, laying supine in bed Eyes: PERRL, lids and conjunctivae normal ENMT: Mucous membranes are moist. Posterior pharynx clear of any exudate or lesions. Normal dentition.  Neck: normal, supple, no masses. Respiratory: clear to auscultation bilaterally, no wheezing, no crackles. Normal respiratory effort. No accessory muscle use.  Cardiovascular: Regular rate and rhythm, no murmurs / rubs / gallops. No extremity edema. 2+ pedal pulses. Abdomen: Mild suprapubic tenderness, no masses palpated.  Right-sided CVA tenderness.  No hepatosplenomegaly. Bowel sounds positive.  Musculoskeletal: no clubbing / cyanosis. No joint deformity upper and lower extremities. Good ROM, no contractures. Normal muscle tone.  Skin: no rashes, lesions, ulcers. No induration Neurologic: CN 2-12 grossly intact. Sensation intact, Strength 5/5 in all 4.  Psychiatric: Normal judgment and insight. Alert and oriented x 3. Normal mood.   Labs on Admission: I have personally reviewed following labs and imaging studies  CBC: Recent Labs  Lab 09/18/18 1802  WBC 13.2*  HGB 11.7*  HCT 37.2  MCV 83.4  PLT 437*   Basic Metabolic Panel: Recent Labs  Lab 09/18/18 1802  NA 128*  K 4.3  CL 87*  CO2 25  GLUCOSE 603*  BUN 17  CREATININE 0.70  CALCIUM 9.2   GFR: Estimated Creatinine Clearance: 55.7 mL/min (by C-G formula based on SCr of 0.7 mg/dL). Liver Function Tests: No results for input(s): AST, ALT, ALKPHOS, BILITOT, PROT, ALBUMIN in the last 168 hours. No results for input(s): LIPASE, AMYLASE in the last 168 hours. No results for input(s): AMMONIA in the last 168 hours. Coagulation  Profile: No results for input(s): INR, PROTIME in the last 168 hours. Cardiac Enzymes: No results for input(s): CKTOTAL, CKMB, CKMBINDEX, TROPONINI in the last 168 hours. BNP (last 3 results) No results for input(s): PROBNP in the last 8760 hours. HbA1C: Recent Labs    09/18/18 1541  HGBA1C 14.9*   CBG: Recent Labs  Lab 09/18/18 1759 09/18/18 2143 09/18/18 2247 09/18/18 2354 09/19/18 0101  GLUCAP 503* 388* 276* 274* 230*   Lipid Profile: No results for input(s): CHOL, HDL, LDLCALC, TRIG, CHOLHDL, LDLDIRECT in the last 72 hours. Thyroid Function Tests: No results for input(s): TSH, T4TOTAL, FREET4, T3FREE, THYROIDAB in the last 72 hours. Anemia Panel: No results for input(s): VITAMINB12, FOLATE, FERRITIN, TIBC, IRON, RETICCTPCT in the last 72 hours. Urine analysis:    Component Value Date/Time   COLORURINE STRAW (A) 09/18/2018 1828   APPEARANCEUR HAZY (A) 09/18/2018 1828   LABSPEC 1.023 09/18/2018 1828   PHURINE 6.0 09/18/2018 1828   GLUCOSEU >=500 (A) 09/18/2018 1828   HGBUR SMALL (A) 09/18/2018 1828   BILIRUBINUR NEGATIVE 09/18/2018 1828   BILIRUBINUR negative 09/18/2018 1538  KETONESUR NEGATIVE 09/18/2018 1828   PROTEINUR NEGATIVE 09/18/2018 1828   UROBILINOGEN 0.2 09/18/2018 1538   NITRITE NEGATIVE 09/18/2018 1828   LEUKOCYTESUR MODERATE (A) 09/18/2018 1828    Radiological Exams on Admission: Ct Abdomen Pelvis W Contrast  Result Date: 09/18/2018 CLINICAL DATA:  Unspecified abdominal pain, question pyelonephritis EXAM: CT ABDOMEN AND PELVIS WITH CONTRAST TECHNIQUE: Multidetector CT imaging of the abdomen and pelvis was performed using the standard protocol following bolus administration of intravenous contrast. CONTRAST:  13m OMNIPAQUE IOHEXOL 300 MG/ML  SOLN COMPARISON:  None. FINDINGS: Lower chest: Top-normal heart size. Clear lung bases. Hepatobiliary: Steatosis of the liver without space-occupying mass. Mild fatty infiltration adjacent to the falciform.  Nondistended gallbladder free of stones. No biliary dilatation. Pancreas: Normal Spleen: Normal Adrenals/Urinary Tract: Abnormal enhancement of the right kidney with in homogeneous slightly striated appearance compatible with right-sided pyelonephritis. Extrarenal pelvis is noted on left with normal enhancement of the left kidney. Punctate upper pole renal calculus is seen on the left. No hydroureteronephrosis. Urothelial enhancement the right ureter compatible with urinary tract infection. The urinary bladder is unremarkable. Stomach/Bowel: Decompressed stomach. Normal small bowel rotation. Mild fluid-filled distention without mechanical obstruction involving distal jejunum and ileum. Findings may represent mild small bowel enteritis, dysmotility or ileus among some considerations. Moderate stool retention is seen within the right colon. Normal appendix is identified. Vascular/Lymphatic: Aortic atherosclerosis. No enlarged abdominal or pelvic lymph nodes. Reproductive: Uterus and bilateral adnexa are unremarkable. Other: No abdominal wall hernia or abnormality. No abdominopelvic ascites. Musculoskeletal: Degenerative disc disease L5-S1. No acute nor suspicious osseous abnormalities. IMPRESSION: 1. Findings in keeping with right-sided pyelonephritis. Urothelial enhancement of the right ureter is noted compatible with urinary tract infection as well. There are more confluent areas of hypodensity involving the right kidney in both upper and lower pole as well as interpolar aspect. A developing lobar nephronia is not excluded. 2. Mild fluid-filled distention of small bowel without mechanical obstruction. Findings may represent a mild small bowel enteritis, dysmotility or ileus among some considerations. 3. Hepatic steatosis. 4. Degenerative disc disease L5-S1. Aortic Atherosclerosis (ICD10-I70.0). Electronically Signed   By: DAshley RoyaltyM.D.   On: 09/18/2018 21:05     Assessment/Plan Principal Problem:    Pyelonephritis Active Problems:   Type 2 diabetes mellitus with hyperglycemia, without long-term [place] use of insulin [place]  SAdja Ruffis a 58y.o. female with medical history significant for recently diagnosed type 2 diabetes who is admitted with right-sided pyelonephritis and hyperglycemia.   Right-sided pyelonephritis: Patient was symptomatic and CT evidence of right-sided pyelonephritis. -Continue IV ceftriaxone -Maintenance IV fluids overnight -Follow-up urine culture -Antiemetics, oral pain meds as needed -Diet as tolerated  Type 2 diabetes with hyperglycemia: Recently diagnosed with A1c 14.9% on 09/18/2018.  He has not been on treatment as an outpatient.  Initially started on insulin infusion without evidence of DKA.  CBG down to 230 and insulin infusion discontinued. -Start Lantus 10 units, sensitive SSI, monitor CBGs   DVT prophylaxis: Lovenox Code Status: Full code, confirmed with patient via video interpreter Family Communication: None present at bedside Disposition Plan: Pending clinical progress Consults called: None Admission status: Observation   VZada FindersMD [facility] Pager [phone] If 7PM-7AM, please contact night-coverage [email or web address]  09/19/2018, 1:11 AM

## 2018-09-18 NOTE — Progress Notes (Signed)
Subjective:    Patient ID: Nancy Stephenson, female    DOB: October 07, 1960, 58 y.o.   MRN: [id number]  Due to a language barrier, patient is accompanied by a live interpreter at today's visit  HPI       58 yo female new to the practice. Patient reports that she was recently diagnosed with diabetes about a month ago and started on medication by mouth for treatment of diabetes.  Patient states that she has been taking the metformin and Glucotrol daily.  Patient does not have a home blood sugar monitor.  Patient reports that she recently was seen in the [facility] urgent care and treated for bladder infection and patient was told that her blood sugars might be elevated secondary to her bladder infection.  Patient states that the urgent care did not make any changes in her medications for the treatment of diabetes. Patient has had increased thirst and some urinary frequency which she thinks may be related to the UTI. She does feel better than she did prior to her ED visit but she does not feel that her symptoms have completely resolved.   Past Medical History:  Diagnosis Date  . Diabetes mellitus without complication ([place])   . Pre-diabetes    Past Surgical History:  Procedure Laterality Date  . CESAREAN SECTION     Family History  Problem Relation Age of Onset  . Hypertension Sister   . Hypertension Brother    Social History   Tobacco Use  . Smoking status: Never Smoker  . Smokeless tobacco: Never Used  Substance Use Topics  . Alcohol use: Never    Frequency: Never  . Drug use: Never   Allergies  Allergen Reactions  . Fish Allergy Shortness Of Breath, Itching and Swelling  . Shellfish-Derived Products Shortness Of Breath, Itching and Swelling     Review of Systems  Constitutional: Positive for fatigue. Negative for chills and fever.  HENT: Negative for congestion, sore throat and trouble swallowing.   Respiratory: Negative for cough and shortness of breath.     Cardiovascular: Negative for chest pain, palpitations and leg swelling.  Gastrointestinal: Negative for abdominal pain, blood in stool, constipation, diarrhea and nausea.  Endocrine: Positive for polydipsia and polyuria. Negative for polyphagia.  Genitourinary: Positive for frequency. Negative for dysuria.  Musculoskeletal: Negative for arthralgias and back pain.  Neurological: Negative for dizziness and headaches.  Hematological: Negative for adenopathy. Does not bruise/bleed easily.       Objective:   Physical Exam Vitals signs and nursing note reviewed.  Constitutional:      General: She is not in acute distress.    Appearance: Normal appearance. She is not ill-appearing, toxic-appearing or diaphoretic.  Neck:     Musculoskeletal: Normal range of motion and neck supple.  Cardiovascular:     Rate and Rhythm: Normal rate and regular rhythm.  Pulmonary:     Effort: Pulmonary effort is normal.     Breath sounds: Normal breath sounds.  Abdominal:     General: Bowel sounds are normal.     Palpations: Abdomen is soft. There is no mass.     Tenderness: There is abdominal tenderness (mild suprapubic discomfort to palp). There is no right CVA tenderness, left CVA tenderness, guarding or rebound.  Musculoskeletal:        General: No swelling or tenderness.     Right lower leg: No edema.     Left lower leg: No edema.  Lymphadenopathy:  Cervical: No cervical adenopathy.  Skin:    General: Skin is warm and dry.  Neurological:     General: No focal deficit present.     Mental Status: She is alert and oriented to person, place, and time.  Psychiatric:        Mood and Affect: Mood normal.        Behavior: Behavior normal.           Assessment & Plan:   1. Type 2 diabetes mellitus with hyperglycemia, without long-term current use of insulin (HCC) Patient's initial blood sugar here in the office was greater than 600.  Patient was told that she would need to go to the emergency  department for further follow-up but patient did not wish to do this initially.  Discussed with the patient that her actual blood sugar could not be determined in the office as it was too high to be read by the glucometer and patient would need to have blood work done in the emergency department to find out her blood sugar and make sure that there were no other electrolyte abnormalities as well as treatment with fluids and insulin to normalize her blood sugars.  Medical assistant also obtain hemoglobin A1c and urinalysis and patient's hemoglobin A1c was elevated at 14.9.  Urinalysis surprisingly did not show the presence of ketones but did have hematuria and leukocytes consistent with continued urinary tract infection.  As patient initially refused emergency department follow-up, she was provided with prescriptions to obtain home blood sugar monitoring supplies including glucometer, strips and lancets as patient states that she did not currently have these items and patient was told that she still needed emergency department follow-up but with the prescriptions would be able to pick up needed supplies status post discharge.  Patient did contact her family who apparently also encourage patient to go to the emergency department for further follow-up and patient did agree by the end of the visit.  Patient was told that she can bring her prescriptions from hospital discharge to this pharmacy or have the medication sent from the hospital to this pharmacy where she would be able to obtain them at a more reasonable cost.  Patient was provided with medications which she was on prior to her visit but I discussed with the patient that these medications would likely change as her A1c indicates that her blood sugar has not been well controlled over the last 90 days with the use of her current medications. Information on hyperglycemia in Spanish given as part of AVS-after visit summary -Glucose (CBG) -Hgb A1c POCT URINALYSIS  DIP (CLINITEK) - Blood Glucose Monitoring Suppl (TRUE METRIX AIR GLUCOSE METER) w/Device KIT; 1 kit by Does not apply route 4 (four) times daily -  before meals and at bedtime.  Dispense: 1 kit; Refill: 0 - TRUEPLUS LANCETS 28G MISC; Use to check blood sugar 3 times per day  Dispense: 100 each; Refill: 11 - metFORMIN (GLUCOPHAGE) 1000 MG tablet; Take 1 tablet (1,000 mg total) by mouth 2 (two) times daily after a meal. (Patient not taking: Reported on 10/08/2018)  Dispense: 60 tablet; Refill: 3 - glipiZIDE (GLUCOTROL) 5 MG tablet; Take 1 tablet (5 mg total) by mouth 2 (two) times daily before a meal. (Patient not taking: Reported on 10/08/2018)  Dispense: 60 tablet; Refill: 0  2. Acute cystitis with hematuria Patient is status post emergency department visit on 08/14/2018 at which time patient was found to have acute cystitis without hematuria at that time. Patient  with positive nitrites on UA but also with glucose of 466 at ED and WBC of 16.2. Per ED  notes patient also had tachycardia initially and was given IV fluid bolus with resolution of tachycardia. No change made in her diabetes medications and patient was discharged to home on Keflex. Patient still appears to have UTI. Patient was encouraged to go to the ED for further evaluation as her hyperglycemia may be due to her UTI or urosepsis despite the fact that patient does not appear acutely ill here in the office.   An After Visit Summary was printed and given to the patient.  Return in about 4 days (around 09/22/2018) for DM- go to the ED; f/u here next week.

## 2018-09-18 NOTE — ED Triage Notes (Signed)
Patient states she was sent to ED after having a high CBG - had her blood glucose checked at internal medicine today. Denies history of diabetes, states she was eating right before having it checked. Denies polyuria, polydipsia.

## 2018-09-19 ENCOUNTER — Other Ambulatory Visit: Payer: Self-pay

## 2018-09-19 ENCOUNTER — Encounter (HOSPITAL_COMMUNITY): Payer: Self-pay | Admitting: *Deleted

## 2018-09-19 LAB — BASIC METABOLIC PANEL
Anion gap: 9 (ref 5–15)
BUN: 13 mg/dL (ref 6–20)
CO2: 26 mmol/L (ref 22–32)
Calcium: 8.3 mg/dL — ABNORMAL LOW (ref 8.9–10.3)
Chloride: 99 mmol/L (ref 98–111)
Creatinine, Ser: 0.7 mg/dL (ref 0.44–1.00)
GFR calc Af Amer: >60 mL/min (ref >60)
GFR calc non Af Amer: >60 mL/min (ref >60)
Glucose, Bld: 386 mg/dL — ABNORMAL HIGH (ref 70–99)
POTASSIUM: 3.7 mmol/L (ref 3.5–5.1)
Sodium: 134 mmol/L — ABNORMAL LOW (ref 135–145)

## 2018-09-19 LAB — CBC
HCT: 33.2 % — ABNORMAL LOW (ref 36.0–46.0)
Hemoglobin: 10.6 g/dL — ABNORMAL LOW (ref 12.0–15.0)
MCH: 26.5 pg (ref 26.0–34.0)
MCHC: 31.9 g/dL (ref 30.0–36.0)
MCV: 83 fL (ref 80.0–100.0)
Platelets: 366 10*3/uL (ref 150–400)
RBC: 4 MIL/uL (ref 3.87–5.11)
RDW: 13.6 % (ref 11.5–15.5)
WBC: 11.6 10*3/uL — ABNORMAL HIGH (ref 4.0–10.5)
nRBC: 0 % (ref 0.0–0.2)

## 2018-09-19 LAB — HEPATIC FUNCTION PANEL
ALT: 12 U/L (ref 0–44)
AST: 16 U/L (ref 15–41)
Albumin: 2.7 g/dL — ABNORMAL LOW (ref 3.5–5.0)
Alkaline Phosphatase: 73 U/L (ref 38–126)
Bilirubin, Direct: <0.1 mg/dL (ref 0.0–0.2)
Total Bilirubin: 0.3 mg/dL (ref 0.3–1.2)
Total Protein: 6 g/dL — ABNORMAL LOW (ref 6.5–8.1)

## 2018-09-19 LAB — GLUCOSE, CAPILLARY
Glucose-Capillary: 410 mg/dL — ABNORMAL HIGH (ref 70–99)
Glucose-Capillary: 122 mg/dL — ABNORMAL HIGH (ref 70–99)
Glucose-Capillary: 199 mg/dL — ABNORMAL HIGH (ref 70–99)
Glucose-Capillary: 252 mg/dL — ABNORMAL HIGH (ref 70–99)

## 2018-09-19 LAB — CBG MONITORING, ED: Glucose-Capillary: 230 mg/dL — ABNORMAL HIGH (ref 70–99)

## 2018-09-19 LAB — HIV ANTIBODY (ROUTINE TESTING W REFLEX): HIV SCREEN 4TH GENERATION: NONREACTIVE

## 2018-09-19 MED ORDER — INSULIN ASPART 100 UNIT/ML ~~LOC~~ SOLN
0.0000 [IU] | Freq: Three times a day (TID) | SUBCUTANEOUS | Status: DC
  Administered 2018-09-19: 3 [IU] via SUBCUTANEOUS
  Administered 2018-09-19: 2 [IU] via SUBCUTANEOUS
  Administered 2018-09-20 (×2): 11 [IU] via SUBCUTANEOUS
  Administered 2018-09-20 – 2018-09-21 (×2): 8 [IU] via SUBCUTANEOUS
  Administered 2018-09-21: 11 [IU] via SUBCUTANEOUS
  Administered 2018-09-21 – 2018-09-22 (×2): 3 [IU] via SUBCUTANEOUS
  Administered 2018-09-22: 5 [IU] via SUBCUTANEOUS

## 2018-09-19 MED ORDER — INSULIN ASPART 100 UNIT/ML ~~LOC~~ SOLN: 0.0000 [IU] | Freq: Three times a day (TID) | SUBCUTANEOUS | Status: DC

## 2018-09-19 MED ORDER — ACETAMINOPHEN 325 MG PO TABS: 650.0000 mg | ORAL_TABLET | Freq: Four times a day (QID) | ORAL | Status: DC | PRN

## 2018-09-19 MED ORDER — INSULIN GLARGINE 100 UNIT/ML ~~LOC~~ SOLN
10.0000 [IU] | Freq: Two times a day (BID) | SUBCUTANEOUS | Status: DC
  Administered 2018-09-19 – 2018-09-22 (×7): 10 [IU] via SUBCUTANEOUS
  Filled 2018-09-19 (×8): qty 0.1

## 2018-09-19 MED ORDER — FLUCONAZOLE 150 MG PO TABS
150.0000 mg | ORAL_TABLET | Freq: Once | ORAL | Status: AC
  Administered 2018-09-19: 150 mg via ORAL
  Filled 2018-09-19: qty 1

## 2018-09-19 MED ORDER — POTASSIUM CHLORIDE IN NACL 20-0.9 MEQ/L-% IV SOLN
INTRAVENOUS | Status: AC
  Administered 2018-09-19: 03:00:00 via INTRAVENOUS
  Filled 2018-09-19: qty 1000

## 2018-09-19 MED ORDER — ENOXAPARIN SODIUM 40 MG/0.4ML ~~LOC~~ SOLN: 40.0000 mg | SUBCUTANEOUS | Status: DC

## 2018-09-19 MED ORDER — ONDANSETRON HCL 4 MG PO TABS: 4.0000 mg | ORAL_TABLET | Freq: Four times a day (QID) | ORAL | Status: DC | PRN

## 2018-09-19 MED ORDER — INSULIN GLARGINE 100 UNIT/ML ~~LOC~~ SOLN
10.0000 [IU] | Freq: Every day | SUBCUTANEOUS | Status: DC
  Administered 2018-09-19: 10 [IU] via SUBCUTANEOUS
  Filled 2018-09-19: qty 0.1

## 2018-09-19 MED ORDER — INSULIN ASPART 100 UNIT/ML ~~LOC~~ SOLN
12.0000 [IU] | Freq: Once | SUBCUTANEOUS | Status: AC
  Administered 2018-09-19: 12 [IU] via SUBCUTANEOUS

## 2018-09-19 MED ORDER — SODIUM CHLORIDE 0.9 % IV SOLN
1.0000 g | INTRAVENOUS | Status: DC
  Administered 2018-09-20 – 2018-09-21 (×2): 1 g via INTRAVENOUS
  Filled 2018-09-19 (×2): qty 10

## 2018-09-19 MED ORDER — OXYCODONE HCL 5 MG PO TABS: 5.0000 mg | ORAL_TABLET | ORAL | Status: DC | PRN

## 2018-09-19 MED ORDER — METFORMIN HCL 500 MG PO TABS
1000.0000 mg | ORAL_TABLET | Freq: Two times a day (BID) | ORAL | Status: DC
  Administered 2018-09-19 – 2018-09-22 (×6): 1000 mg via ORAL
  Filled 2018-09-19 (×6): qty 2

## 2018-09-19 MED ORDER — ONDANSETRON HCL 4 MG/2ML IJ SOLN: 4.0000 mg | Freq: Four times a day (QID) | INTRAMUSCULAR | Status: DC | PRN

## 2018-09-19 MED ORDER — ACETAMINOPHEN 650 MG RE SUPP: 650.0000 mg | Freq: Four times a day (QID) | RECTAL | Status: DC | PRN

## 2018-09-19 MED ORDER — INSULIN ASPART 100 UNIT/ML ~~LOC~~ SOLN
0.0000 [IU] | Freq: Every day | SUBCUTANEOUS | Status: DC
  Administered 2018-09-19: 2 [IU] via SUBCUTANEOUS
  Administered 2018-09-19: 3 [IU] via SUBCUTANEOUS
  Administered 2018-09-20: 2 [IU] via SUBCUTANEOUS
  Administered 2018-09-21: 3 [IU] via SUBCUTANEOUS

## 2018-09-19 MED ORDER — ENOXAPARIN SODIUM 40 MG/0.4ML ~~LOC~~ SOLN
40.0000 mg | SUBCUTANEOUS | Status: DC
  Administered 2018-09-19 – 2018-09-20 (×2): 40 mg via SUBCUTANEOUS
  Filled 2018-09-19 (×2): qty 0.4

## 2018-09-19 NOTE — Progress Notes (Signed)
New Admission Note:   Arrival Method:  Via stretcher from the ED Mental Orientation:  A & O x 4 Telemetry:   N/A Assessment: Completed Skin:  Intact IV:  See Flowsheet Pain:  Denies Tubes:  None Safety Measures: Safety Fall Prevention Plan has been given, discussed and signed Admission: Completed [address] Orientation: Patient has been orientated to the room, unit and staff.  Family:  None at bedside  Nurse tech on the floor speaks fluent Spanish was able to translate between RN and patient.  Only cell phone at bedside.  All admission questions answered.  Also, all patient's questions were answered.  Orders have been reviewed and implemented. Will continue to monitor the patient. Call light has been placed within reach and bed alarm has been activated.   Bernie Covey RN- BC, [facility] Phone number: [phone]

## 2018-09-19 NOTE — Progress Notes (Signed)
ED RN with another patient.  Will call back with report.  Room is ready.  Bernie Covey RN

## 2018-09-19 NOTE — Progress Notes (Addendum)
Notified MD Ogbata of CBG 410- this CBG was taken after she had eaten breakfast (NT unaware that pt was ACHS). Awaiting response.   [id number] MD called back. MD gave verbal orders for 12 units of insulin aspart NOW, Increase lantus frequency to twice daily and to change sliding scale from low to moderate.   Leonia ReevesNatalie N Cayce Paschal, RN

## 2018-09-19 NOTE — Plan of Care (Signed)
Nutrition Education Note  Given reported wt loss in setting of uncontrolled DM2, RD provided diet education. Utilized translator device-miguel ID [id number]  Lab Results  Component Value Date   HGBA1C 14.9 (A) 09/18/2018   Pt said very little throughout discussion, half of time only giving 1 word responses.   Went through diet recall. She most often will have coffee with a biscuit. She does occassionally skip breakfast. Her lunches and dinners are home cooked. Other than coffee, she only drinks water.   RD reviewed what items had carbohydrates in them. Explained importance of Protein/fiber in balancing out carbohydrates and attenuating glycemic response. Gave examples of how she could balance out her reported breakfast with either a high protein item (eggs) or a high fiber item (oatmeal).   RD reviewed the importance of not skipping meals and the "My Plate" Ratios; she should make 75% of meal pro/veg and 25% carb/starches. We reviewed what the starchy vegetables consisted of. Recommended whole grains.   Pt asked "what can I eat and what can I not eat". RD reiterated that starches, grains, dairy and fruit will all increase her BG. Noted Rice, tortillas, tacos, etc will all increase her BG.Protein/vegetables will be free foods.  RD then reviewed label reading. RD showed picture of a label and told her how to calculate the amount of carbs she eats. Asked her to aim for 45-75 g/meal.   She asked if she needs to take insulin every day to which RD said yes. She seemed depressed. RD tried to encourage her by noting she may be able to get off of insulin eventually by modifying her diet and increasing activity.   Her diet report is suspect, as it does not sound to equate to an A1C of >14. Unable to truly assist with diet management without receiving accurate report.   RD provided spanish DM handouts regarding label reading and carb counting. No further nutrition interventions warranted at this time. If  additional nutrition issues arise, please re-consult RD.  Christophe Louis RD, LDN, CNSC Clinical Nutrition Available Tues-Sat via Pager: [phone] 09/19/2018 7:07 PM

## 2018-09-19 NOTE — Progress Notes (Signed)
PROGRESS NOTE    Nancy Stephenson  MAU:[id number] DOB: September 23, 1960 DOA: 09/18/2018 PCP: Cain Saupe, MD  Outpatient Specialists:    Brief Narrative:  Nancy Stephenson is a 58 y.o. female with medical history significant for recently diagnosed type 2 diabetes who presents to the ED from her PCP office for hyperglycemia.  On presentation, the point-of-care glucose >600, A1c 14.9, and reported UTI symptoms.  Apparently, the patient was seen at the ER on 08/23/2018 for UTI and was treated with Keflex, but the UTI symptoms returned shortly afterwards.  Patient reported suprapubic discomfort, intermittent right-sided back pain.  No associated nausea or vomiting.  On admission, Labs notable for serum glucose 603, sodium 128 (140 when corrected for hyperglycemia), potassium 4.3, chloride 87, bicarb 25, BUN 17, creatinine 0.7, anion gap 16, WBC 13.2, hemoglobin 11.7, platelets 421, urinalysis showed > 500 glucose, negative ketones, moderate leukocytes, negative nitrites, rare bacteria.  I-STAT beta-hCG is negative.  CT abdomen/pelvis with contrast was obtained which showed finding suggestive of right-sided pyelonephritis.  Urine culture was collected and patient was started IV ceftriaxone.  She was given 1 L normal saline and started on IV insulin infusion.  The hospital service was consulted admit for further management.  09/19/2018: Patient continues to report suprapubic discomfort, and query right upper quadrant tenderness.  We will continue IV antibiotics.  We will also get a right upper quadrant ultrasound.  Will check LFTs.  Will optimize blood sugar control.  Further management will depend on hospital course.  Assessment & Plan:   Principal Problem:   Pyelonephritis Active Problems:   Type 2 diabetes mellitus with hyperglycemia, without long-term current use of insulin (HCC)  Possible right-sided pyelonephritis: -Continue IV ceftriaxone -Follow culture results. -Further management  will depend on hospital course.  Right upper quadrant tenderness: -Ultrasound of the right upper quadrant. -Check LFTs. -Further management will depend on above.    Type 2 diabetes with hyperglycemia: -Recently diagnosed with A1c 14.9% on 09/18/2018.   -Continue long-acting insulin as well as sliding scale insulin coverage.   -Start metformin 1000 mg p.o. twice daily.   -Diabetes education.   -Consult diabetes navigator.   -Further management will depend on hospital course.  He has not been on treatment as an outpatient.    DVT prophylaxis: Lovenox Code Status: Full code Family Communication: None present at bedside Disposition Plan:  Likely discharge back home. Consults called: None  Procedures:   Follow the results of right upper quadrant ultrasound  Antimicrobials:   Rocephin IV   Subjective: Vague suprapubic discomfort and right upper quadrant pain/tenderness  Objective: Vitals:   09/19/18 0145 09/19/18 0221 09/19/18 0231 09/19/18 0513  BP: 110/64 135/86  124/80  Pulse: 62 70  71  Resp: 15 16  16   Temp:  97.9 F (36.6 C)  98.7 F (37.1 C)  TempSrc:  Oral  Oral  SpO2: 97% 99%  99%  Weight:   47.9 kg   Height:   5' (1.524 m)     Intake/Output Summary (Last 24 hours) at 09/19/2018 1012 Last data filed at 09/19/2018 0600 Gross per 24 hour  Intake 2623.68 ml  Output 500 ml  Net 2123.68 ml   Filed Weights   09/19/18 0231  Weight: 47.9 kg    Examination:  General exam: Appears calm and comfortable  Respiratory system: Clear to auscultation.  Cardiovascular system: S1 & S2 heard. Gastrointestinal system: Abdomen is nondistended, soft with suprapubic discomfort and right upper quadrant tenderness.  Organs  are difficult to assess.   Central nervous system: Alert and oriented. No focal neurological deficits. Extremities: No leg edema  Data Reviewed: I have personally reviewed following labs and imaging studies  CBC: Recent Labs  Lab 09/18/18 1802  09/19/18 0526  WBC 13.2* 11.6*  HGB 11.7* 10.6*  HCT 37.2 33.2*  MCV 83.4 83.0  PLT 421* 366   Basic Metabolic Panel: Recent Labs  Lab 09/18/18 1802 09/19/18 0526  NA 128* 134*  K 4.3 3.7  CL 87* 99  CO2 25 26  GLUCOSE 603* 386*  BUN 17 13  CREATININE 0.70 0.70  CALCIUM 9.2 8.3*   GFR: Estimated Creatinine Clearance: 55.7 mL/min (by C-G formula based on SCr of 0.7 mg/dL). Liver Function Tests: No results for input(s): AST, ALT, ALKPHOS, BILITOT, PROT, ALBUMIN in the last 168 hours. No results for input(s): LIPASE, AMYLASE in the last 168 hours. No results for input(s): AMMONIA in the last 168 hours. Coagulation Profile: No results for input(s): INR, PROTIME in the last 168 hours. Cardiac Enzymes: No results for input(s): CKTOTAL, CKMB, CKMBINDEX, TROPONINI in the last 168 hours. BNP (last 3 results) No results for input(s): PROBNP in the last 8760 hours. HbA1C: Recent Labs    09/18/18 1541  HGBA1C 14.9*   CBG: Recent Labs  Lab 09/18/18 2143 09/18/18 2247 09/18/18 2354 09/19/18 0101 09/19/18 0834  GLUCAP 388* 276* 274* 230* 410*   Lipid Profile: No results for input(s): CHOL, HDL, LDLCALC, TRIG, CHOLHDL, LDLDIRECT in the last 72 hours. Thyroid Function Tests: No results for input(s): TSH, T4TOTAL, FREET4, T3FREE, THYROIDAB in the last 72 hours. Anemia Panel: No results for input(s): VITAMINB12, FOLATE, FERRITIN, TIBC, IRON, RETICCTPCT in the last 72 hours. Urine analysis:    Component Value Date/Time   COLORURINE STRAW (A) 09/18/2018 1828   APPEARANCEUR HAZY (A) 09/18/2018 1828   LABSPEC 1.023 09/18/2018 1828   PHURINE 6.0 09/18/2018 1828   GLUCOSEU >=500 (A) 09/18/2018 1828   HGBUR SMALL (A) 09/18/2018 1828   BILIRUBINUR NEGATIVE 09/18/2018 1828   BILIRUBINUR negative 09/18/2018 1538   KETONESUR NEGATIVE 09/18/2018 1828   PROTEINUR NEGATIVE 09/18/2018 1828   UROBILINOGEN 0.2 09/18/2018 1538   NITRITE NEGATIVE 09/18/2018 1828   LEUKOCYTESUR  MODERATE (A) 09/18/2018 1828   Sepsis Labs: @LABRCNTIP (procalcitonin:4,lacticidven:4)  )No results found for this or any previous visit (from the past 240 hour(s)).       Radiology Studies: Ct Abdomen Pelvis W Contrast  Result Date: 09/18/2018 CLINICAL DATA:  Unspecified abdominal pain, question pyelonephritis EXAM: CT ABDOMEN AND PELVIS WITH CONTRAST TECHNIQUE: Multidetector CT imaging of the abdomen and pelvis was performed using the standard protocol following bolus administration of intravenous contrast. CONTRAST:  100mL OMNIPAQUE IOHEXOL 300 MG/ML  SOLN COMPARISON:  None. FINDINGS: Lower chest: Top-normal heart size. Clear lung bases. Hepatobiliary: Steatosis of the liver without space-occupying mass. Mild fatty infiltration adjacent to the falciform. Nondistended gallbladder free of stones. No biliary dilatation. Pancreas: Normal Spleen: Normal Adrenals/Urinary Tract: Abnormal enhancement of the right kidney with in homogeneous slightly striated appearance compatible with right-sided pyelonephritis. Extrarenal pelvis is noted on left with normal enhancement of the left kidney. Punctate upper pole renal calculus is seen on the left. No hydroureteronephrosis. Urothelial enhancement the right ureter compatible with urinary tract infection. The urinary bladder is unremarkable. Stomach/Bowel: Decompressed stomach. Normal small bowel rotation. Mild fluid-filled distention without mechanical obstruction involving distal jejunum and ileum. Findings may represent mild small bowel enteritis, dysmotility or ileus among some considerations. Moderate stool retention is  seen within the right colon. Normal appendix is identified. Vascular/Lymphatic: Aortic atherosclerosis. No enlarged abdominal or pelvic lymph nodes. Reproductive: Uterus and bilateral adnexa are unremarkable. Other: No abdominal wall hernia or abnormality. No abdominopelvic ascites. Musculoskeletal: Degenerative disc disease L5-S1. No acute nor  suspicious osseous abnormalities. IMPRESSION: 1. Findings in keeping with right-sided pyelonephritis. Urothelial enhancement of the right ureter is noted compatible with urinary tract infection as well. There are more confluent areas of hypodensity involving the right kidney in both upper and lower pole as well as interpolar aspect. A developing lobar nephronia is not excluded. 2. Mild fluid-filled distention of small bowel without mechanical obstruction. Findings may represent a mild small bowel enteritis, dysmotility or ileus among some considerations. 3. Hepatic steatosis. 4. Degenerative disc disease L5-S1. Aortic Atherosclerosis (ICD10-I70.0). Electronically Signed   By: Tollie Eth M.D.   On: 09/18/2018 21:05        Scheduled Meds: . fluconazole  150 mg Oral Once  . insulin aspart  0-15 Units Subcutaneous TID WC  . insulin aspart  0-5 Units Subcutaneous QHS  . insulin glargine  10 Units Subcutaneous BID  .  morphine injection  4 mg Intravenous Once   Continuous Infusions: . 0.9 % NaCl with KCl 20 mEq / L 100 mL/hr at 09/19/18 0313  . [START ON 09/20/2018] cefTRIAXone (ROCEPHIN)  IV       LOS: 0 days    Time spent: 25 minutes   Berton Mount, MD  [facility] Pager #: [phone] 7PM-7AM contact night coverage as above

## 2018-09-19 NOTE — Plan of Care (Signed)
See previous note

## 2018-09-20 ENCOUNTER — Inpatient Hospital Stay (HOSPITAL_COMMUNITY): Payer: Self-pay

## 2018-09-20 LAB — GLUCOSE, CAPILLARY
Glucose-Capillary: 321 mg/dL — ABNORMAL HIGH (ref 70–99)
Glucose-Capillary: 331 mg/dL — ABNORMAL HIGH (ref 70–99)
Glucose-Capillary: 227 mg/dL — ABNORMAL HIGH (ref 70–99)

## 2018-09-20 MED ORDER — INSULIN STARTER KIT- PEN NEEDLES (SPANISH)
1.0000 | Freq: Once | Status: AC
  Administered 2018-09-20: 1
  Filled 2018-09-20: qty 1

## 2018-09-20 MED ORDER — LIVING WELL WITH DIABETES BOOK - IN SPANISH
Freq: Once | Status: AC
  Administered 2018-09-20: 14:00:00
  Filled 2018-09-20: qty 1

## 2018-09-20 NOTE — Progress Notes (Signed)
Inpatient Diabetes Program Recommendations  AACE/ADA: New Consensus Statement on Inpatient Glycemic Control (2015)  Target Ranges:  Prepandial:   less than 140 mg/dL      Peak postprandial:   less than 180 mg/dL (1-2 hours)      Critically ill patients:  140 - 180 mg/dL   Results for Nancy Stephenson, Nancy Stephenson (MRN [id number]) as of 09/20/2018 13:33  Ref. Range 09/19/2018 08:34 09/19/2018 11:21 09/19/2018 16:22 09/19/2018 20:43  Glucose-Capillary Latest Ref Range: 70 - 99 mg/dL 410 (H)  12 units NOVOLOG  122 (H)  2 units NOVOLOG +  10 units LANTUS given at 10:47am 199 (H)  3 units NOVOLOG  252 (H)  3 units NOVOLOG +  10 units LANTUS   Results for Nancy Stephenson, Nancy Stephenson (MRN [id number]) as of 09/20/2018 13:33  Ref. Range 09/20/2018 07:13  Glucose-Capillary Latest Ref Range: 70 - 99 mg/dL 321 (H)  11 units NOVOLOG +  10 units LANTUS at 9am    Results for Nancy Stephenson, Nancy Stephenson (MRN [id number]) as of 09/20/2018 13:33  Ref. Range 09/18/2018 15:41  Hemoglobin A1C Latest Ref Range: 4.0 - 5.6 % 14.9 (A)    Admit with: Pyelonephritis and Hyperglycemia/ New Diagnosis of Diabetes  Current Orders: Lantus 10 units BID      Novolog Moderate Correction Scale/ SSI (0-15 units) TID AC + HS      Metformin 1000 mg BID    MD- Please consider the following in-hospital insulin adjustments:  1. Increase Lantus slightly to 12 units BID  2. Start Novolog Meal Coverage: Novolog 6 units TID with meals  (Please add the following Hold Parameters: Hold if pt eats <50% of meal, Hold if pt NPO)  3. Patient without insurance.  Will need to check to see what meds the [facility] has so we can send pt home on financially affordable regimen.  Clinic Pharmacy re-opens on Monday so she can get her meds at the clinic pharmacy.    Seen by Dr. Antony Blackbird over at the [facility] wellness clinic on 01/24 to Establish care.  Was immediately sent to [facility] ED for  extremely high blood glucose.  Note the RD has met with pt and provided DM diet education.  Diabetes Coordinator not physically present on campus over the weekend--Available by phone for assistance and recommendations for glycemic control.  Patient will be seen on Monday by one of the DM Coordinators if not discharged home today.  Will ask the nurses to please begin Insulin education with this pt to prepare her for d/c.     --Will follow patient during hospitalization--  Wyn Quaker RN, MSN, CDE Diabetes Coordinator Inpatient Glycemic Control Team Team Pager: [phone] (8a-5p)

## 2018-09-20 NOTE — Progress Notes (Signed)
PROGRESS NOTE    Nancy Stephenson  DDU:[id number] DOB: 1960/12/22 DOA: 09/18/2018 PCP: Cain Saupe, MD  Outpatient Specialists:    Brief Narrative:  Nancy Stephenson is a 58 y.o. female with medical history significant for recently diagnosed type 2 diabetes who presents to the ED from her PCP office for hyperglycemia.  On presentation, the point-of-care glucose >600, A1c 14.9, and reported UTI symptoms.  Apparently, the patient was seen at the ER on 08/23/2018 for UTI and was treated with Keflex, but the UTI symptoms returned shortly afterwards.  Patient reported suprapubic discomfort, intermittent right-sided back pain.  No associated nausea or vomiting.  On admission, Labs notable for serum glucose 603, sodium 128 (140 when corrected for hyperglycemia), potassium 4.3, chloride 87, bicarb 25, BUN 17, creatinine 0.7, anion gap 16, WBC 13.2, hemoglobin 11.7, platelets 421, urinalysis showed > 500 glucose, negative ketones, moderate leukocytes, negative nitrites, rare bacteria.  I-STAT beta-hCG is negative.  CT abdomen/pelvis with contrast was obtained which showed finding suggestive of right-sided pyelonephritis.  Urine culture was collected and patient was started IV ceftriaxone.  She was given 1 L normal saline and started on IV insulin infusion.  The hospital service was consulted admit for further management.  09/19/2018: Patient continues to report suprapubic discomfort, and query right upper quadrant tenderness.  We will continue IV antibiotics.  We will also get a right upper quadrant ultrasound.  Will check LFTs.  Will optimize blood sugar control.  Further management will depend on hospital course.  09/20/2018: Patient seen.  Right upper quadrant ultrasound reveals normal gallbladder, but also reported fatty infiltrated liver.  Urine culture is growing E. coli, but final cultures are pending.  Ackley discharge home tomorrow once final cultures are known.  Good blood sugar control  is improving.  Assessment & Plan:   Principal Problem:   Pyelonephritis Active Problems:   Type 2 diabetes mellitus with hyperglycemia, without long-term current use of insulin (HCC)  Possible right-sided pyelonephritis: -Continue IV ceftriaxone -Follow culture results. -Further management will depend on hospital course. -09/20/2018: Continue current antibiotics.  Follow final cultures.  Right upper quadrant tenderness: -Ultrasound of the right upper quadrant. -Check LFTs. -Further management will depend on above.   -09/20/2018: Right upper quadrant ultrasound reveals normal gallbladder, but also reported fatty infiltrated liver.  Type 2 diabetes with hyperglycemia: -Recently diagnosed with A1c 14.9% on 09/18/2018.   -Continue long-acting insulin as well as sliding scale insulin coverage.   -Start metformin 1000 mg p.o. twice daily.   -Diabetes education.   -Consult diabetes navigator.   -Further management will depend on hospital course.  He has not been on treatment as an outpatient.   09/20/2018: Blood sugar control is improving.  DVT prophylaxis: Lovenox Code Status: Full code Family Communication: None present at bedside Disposition Plan:  Likely discharge back home. Consults called: None  Procedures:   Follow the results of right upper quadrant ultrasound  Antimicrobials:   Rocephin IV   Subjective: No new complaints.   No fever or chills.   Abdominal pain is improving.   Objective: Vitals:   09/19/18 1621 09/19/18 2042 09/20/18 0534 09/20/18 0939  BP: 128/87 109/71 113/67 113/70  Pulse: 68 67 92 91  Resp: 18 18 18 18   Temp: 98.1 F (36.7 C) 98.2 F (36.8 C) 99.6 F (37.6 C) 98.7 F (37.1 C)  TempSrc: Oral Oral Oral Oral  SpO2: 100% 98% 95% 98%  Weight:  47.8 kg    Height:  Intake/Output Summary (Last 24 hours) at 09/20/2018 0952 Last data filed at 09/20/2018 0939 Gross per 24 hour  Intake 1080 ml  Output 0 ml  Net 1080 ml   Filed  Weights   09/19/18 0231 09/19/18 2042  Weight: 47.9 kg 47.8 kg    Examination:  General exam: Appears calm and comfortable  Respiratory system: Clear to auscultation.  Cardiovascular system: S1 & S2 heard. Gastrointestinal system: Abdomen is nondistended, soft with vague suprapubic discomfort.  Organs are difficult to assess.   Central nervous system: Alert and oriented. No focal neurological deficits. Extremities: No leg edema  Data Reviewed: I have personally reviewed following labs and imaging studies  CBC: Recent Labs  Lab 09/18/18 1802 09/19/18 0526  WBC 13.2* 11.6*  HGB 11.7* 10.6*  HCT 37.2 33.2*  MCV 83.4 83.0  PLT 421* 366   Basic Metabolic Panel: Recent Labs  Lab 09/18/18 1802 09/19/18 0526  NA 128* 134*  K 4.3 3.7  CL 87* 99  CO2 25 26  GLUCOSE 603* 386*  BUN 17 13  CREATININE 0.70 0.70  CALCIUM 9.2 8.3*   GFR: Estimated Creatinine Clearance: 55.7 mL/min (by C-G formula based on SCr of 0.7 mg/dL). Liver Function Tests: Recent Labs  Lab 09/19/18 1321  AST 16  ALT 12  ALKPHOS 73  BILITOT 0.3  PROT 6.0*  ALBUMIN 2.7*   No results for input(s): LIPASE, AMYLASE in the last 168 hours. No results for input(s): AMMONIA in the last 168 hours. Coagulation Profile: No results for input(s): INR, PROTIME in the last 168 hours. Cardiac Enzymes: No results for input(s): CKTOTAL, CKMB, CKMBINDEX, TROPONINI in the last 168 hours. BNP (last 3 results) No results for input(s): PROBNP in the last 8760 hours. HbA1C: Recent Labs    09/18/18 1541  HGBA1C 14.9*   CBG: Recent Labs  Lab 09/19/18 0834 09/19/18 1121 09/19/18 1622 09/19/18 2043 09/20/18 0713  GLUCAP 410* 122* 199* 252* 321*   Lipid Profile: No results for input(s): CHOL, HDL, LDLCALC, TRIG, CHOLHDL, LDLDIRECT in the last 72 hours. Thyroid Function Tests: No results for input(s): TSH, T4TOTAL, FREET4, T3FREE, THYROIDAB in the last 72 hours. Anemia Panel: No results for input(s):  VITAMINB12, FOLATE, FERRITIN, TIBC, IRON, RETICCTPCT in the last 72 hours. Urine analysis:    Component Value Date/Time   COLORURINE STRAW (A) 09/18/2018 1828   APPEARANCEUR HAZY (A) 09/18/2018 1828   LABSPEC 1.023 09/18/2018 1828   PHURINE 6.0 09/18/2018 1828   GLUCOSEU >=500 (A) 09/18/2018 1828   HGBUR SMALL (A) 09/18/2018 1828   BILIRUBINUR NEGATIVE 09/18/2018 1828   BILIRUBINUR negative 09/18/2018 1538   KETONESUR NEGATIVE 09/18/2018 1828   PROTEINUR NEGATIVE 09/18/2018 1828   UROBILINOGEN 0.2 09/18/2018 1538   NITRITE NEGATIVE 09/18/2018 1828   LEUKOCYTESUR MODERATE (A) 09/18/2018 1828   Sepsis Labs: @LABRCNTIP (procalcitonin:4,lacticidven:4)  ) Recent Results (from the past 240 hour(s))  Urine Culture     Status: Abnormal (Preliminary result)   Collection Time: 09/18/18  9:21 PM  Result Value Ref Range Status   Specimen Description URINE, CLEAN CATCH  Final   Special Requests   Final    NONE Performed at [facility], [address] [place], [place] [address]    Culture 50,000 COLONIES/mL GRAM NEGATIVE RODS (A)  Final   Report Status PENDING  Incomplete         Radiology Studies: Ct Abdomen Pelvis W Contrast  Result Date: 09/18/2018 CLINICAL DATA:  Unspecified abdominal pain, question pyelonephritis EXAM: CT ABDOMEN  AND PELVIS WITH CONTRAST TECHNIQUE: Multidetector CT imaging of the abdomen and pelvis was performed using the standard protocol following bolus administration of intravenous contrast. CONTRAST:  OMNIPAQUE IOHEXOL 300 MG/ML  SOLN COMPARISON:  None. FINDINGS: Lower chest: Top-normal heart size. Clear lung bases. Hepatobiliary: Steatosis of the liver without space-occupying mass. Mild fatty infiltration adjacent to the falciform. Nondistended gallbladder free of stones. No biliary dilatation. Pancreas: Normal Spleen: Normal Adrenals/Urinary Tract: Abnormal enhancement of the right kidney with in homogeneous slightly striated appearance compatible  with right-sided pyelonephritis. Extrarenal pelvis is noted on left with normal enhancement of the left kidney. Punctate upper pole renal calculus is seen on the left. No hydroureteronephrosis. Urothelial enhancement the right ureter compatible with urinary tract infection. The urinary bladder is unremarkable. Stomach/Bowel: Decompressed stomach. Normal small bowel rotation. Mild fluid-filled distention without mechanical obstruction involving distal jejunum and ileum. Findings may represent mild small bowel enteritis, dysmotility or ileus among some considerations. Moderate stool retention is seen within the right colon. Normal appendix is identified. Vascular/Lymphatic: Aortic atherosclerosis. No enlarged abdominal or pelvic lymph nodes. Reproductive: Uterus and bilateral adnexa are unremarkable. Other: No abdominal wall hernia or abnormality. No abdominopelvic ascites. Musculoskeletal: Degenerative disc disease L5-S1. No acute nor suspicious osseous abnormalities. IMPRESSION: 1. Findings in keeping with right-sided pyelonephritis. Urothelial enhancement of the right ureter is noted compatible with urinary tract infection as well. There are more confluent areas of hypodensity involving the right kidney in both upper and lower pole as well as interpolar aspect. A developing lobar nephronia is not excluded. 2. Mild fluid-filled distention of small bowel without mechanical obstruction. Findings may represent a mild small bowel enteritis, dysmotility or ileus among some considerations. 3. Hepatic steatosis. 4. Degenerative disc disease L5-S1. Aortic Atherosclerosis (ICD10-I70.0). Electronically Signed   By: Tollie Eth M.D.   On: 09/18/2018 21:05   US Abdomen Limited Ruq  Result Date: 09/20/2018 CLINICAL DATA:  Abdominal pain EXAM: ULTRASOUND ABDOMEN LIMITED RIGHT UPPER QUADRANT COMPARISON:  None. FINDINGS: Gallbladder: No gallstones or wall thickening visualized. Small amount of sludge. No sonographic Murphy sign  noted by sonographer. Common bile duct: Diameter: 4 mm Liver: Liver is diffusely echogenic indicating fatty infiltration. No focal liver abnormality. Portal vein is patent on color Doppler imaging with normal direction of blood flow towards the liver. IMPRESSION: 1. No acute findings. 2. Small amount of sludge within the gallbladder. No gallstones seen. No evidence of cholecystitis. 3. Fatty infiltration of the liver. Electronically Signed   By: Bary Richard M.D.   On: 09/20/2018 04:32        Scheduled Meds: . enoxaparin (LOVENOX) injection  40 mg Subcutaneous Q24H  . insulin aspart  0-15 Units Subcutaneous TID WC  . insulin aspart  0-5 Units Subcutaneous QHS  . insulin glargine  10 Units Subcutaneous BID  . metFORMIN  1,000 mg Oral BID WC  .  morphine injection  4 mg Intravenous Once   Continuous Infusions: . cefTRIAXone (ROCEPHIN)  IV 1 g (09/20/18 0853)     LOS: 1 day    Time spent: 25 minutes   Berton Mount, MD  [facility] Pager #: [phone] 7PM-7AM contact night coverage as above

## 2018-09-21 LAB — CBC WITH DIFFERENTIAL/PLATELET
Abs Immature Granulocytes: 0.07 10*3/uL (ref 0.00–0.07)
Basophils Absolute: 0.1 10*3/uL (ref 0.0–0.1)
Basophils Relative: 1 %
Eosinophils Absolute: 0.2 10*3/uL (ref 0.0–0.5)
Eosinophils Relative: 1 %
HCT: 34.9 % — ABNORMAL LOW (ref 36.0–46.0)
Hemoglobin: 11.3 g/dL — ABNORMAL LOW (ref 12.0–15.0)
Immature Granulocytes: 1 %
Lymphocytes Relative: 20 %
Lymphs Abs: 2.9 10*3/uL (ref 0.7–4.0)
MCH: 26.8 pg (ref 26.0–34.0)
MCHC: 32.4 g/dL (ref 30.0–36.0)
MCV: 82.9 fL (ref 80.0–100.0)
Monocytes Absolute: 1.2 10*3/uL — ABNORMAL HIGH (ref 0.1–1.0)
Monocytes Relative: 8 %
Neutro Abs: 10.2 10*3/uL — ABNORMAL HIGH (ref 1.7–7.7)
Neutrophils Relative %: 69 %
Platelets: 346 10*3/uL (ref 150–400)
RBC: 4.21 MIL/uL (ref 3.87–5.11)
RDW: 14 % (ref 11.5–15.5)
WBC: 14.6 10*3/uL — ABNORMAL HIGH (ref 4.0–10.5)
nRBC: 0 % (ref 0.0–0.2)

## 2018-09-21 LAB — GLUCOSE, CAPILLARY
GLUCOSE-CAPILLARY: 230 mg/dL — AB (ref 70–99)
GLUCOSE-CAPILLARY: 311 mg/dL — AB (ref 70–99)
Glucose-Capillary: 262 mg/dL — ABNORMAL HIGH (ref 70–99)
Glucose-Capillary: 263 mg/dL — ABNORMAL HIGH (ref 70–99)
Glucose-Capillary: 198 mg/dL — ABNORMAL HIGH (ref 70–99)
Glucose-Capillary: 267 mg/dL — ABNORMAL HIGH (ref 70–99)

## 2018-09-21 LAB — RENAL FUNCTION PANEL
Albumin: 2.6 g/dL — ABNORMAL LOW (ref 3.5–5.0)
Anion gap: 11 (ref 5–15)
BUN: 16 mg/dL (ref 6–20)
CO2: 25 mmol/L (ref 22–32)
Calcium: 8.6 mg/dL — ABNORMAL LOW (ref 8.9–10.3)
Chloride: 96 mmol/L — ABNORMAL LOW (ref 98–111)
Creatinine, Ser: 0.79 mg/dL (ref 0.44–1.00)
GFR calc Af Amer: >60 mL/min (ref >60)
GFR calc non Af Amer: >60 mL/min (ref >60)
Glucose, Bld: 283 mg/dL — ABNORMAL HIGH (ref 70–99)
Phosphorus: 3.2 mg/dL (ref 2.5–4.6)
Potassium: 3.8 mmol/L (ref 3.5–5.1)
Sodium: 132 mmol/L — ABNORMAL LOW (ref 135–145)

## 2018-09-21 LAB — MAGNESIUM: Magnesium: 1.7 mg/dL (ref 1.7–2.4)

## 2018-09-21 LAB — URINE CULTURE: Culture: 50000 — AB

## 2018-09-21 MED ORDER — SODIUM CHLORIDE 0.9 % IV SOLN
INTRAVENOUS | Status: DC | PRN
  Administered 2018-09-21 – 2018-09-22 (×3): 250 mL via INTRAVENOUS

## 2018-09-21 MED ORDER — SODIUM CHLORIDE 0.9 % IV SOLN
1.0000 g | Freq: Three times a day (TID) | INTRAVENOUS | Status: DC
  Administered 2018-09-21 – 2018-09-22 (×2): 1 g via INTRAVENOUS
  Filled 2018-09-21 (×3): qty 1

## 2018-09-21 MED ORDER — INSULIN STARTER KIT- SYRINGES (SPANISH)
1.0000 | Freq: Once | Status: AC
  Administered 2018-09-21: 1
  Filled 2018-09-21: qty 1

## 2018-09-21 MED ORDER — SODIUM CHLORIDE 0.9 % IV SOLN
1.0000 g | Freq: Once | INTRAVENOUS | Status: AC
  Administered 2018-09-21: 1 g via INTRAVENOUS
  Filled 2018-09-21: qty 1

## 2018-09-21 NOTE — Progress Notes (Signed)
Used Stratus interpretation in spanish to demonstrate how to administer insulin. Teach back method was used. Patient self administered insulin 11 units in left lower quadrant of abdomen. Will continue to monitor patient.   Lillia Pauls RN

## 2018-09-21 NOTE — Progress Notes (Addendum)
Inpatient Diabetes Program Recommendations  AACE/ADA: New Consensus Statement on Inpatient Glycemic Control (2015)  Target Ranges:  Prepandial:   less than 140 mg/dL      Peak postprandial:   less than 180 mg/dL (1-2 hours)      Critically ill patients:  140 - 180 mg/dL   Lab Results  Component Value Date   GLUCAP 263 (H) 09/21/2018   HGBA1C 14.9 (A) 09/18/2018    Review of Glycemic Control Results for Nancy Stephenson, Nancy Stephenson (MRN [id number]) as of 09/21/2018 09:22  Ref. Range 09/19/2018 20:43 09/20/2018 07:13 09/20/2018 16:17 09/20/2018 20:41 09/21/2018 07:26  Glucose-Capillary Latest Ref Range: 70 - 99 mg/dL 252 (H) 321 (H) 331 (H) 227 (H) 263 (H)   Inpatient Diabetes Program Recommendations:   Spoke with [place] and Lantus is available by pen and vial and Novolog available only by syringe. Recommend: -Lantus by vial/syringe 12 units bid -Novolog by vial/syringe 6 units tid if patient eats 50%  9:40 Met with patient by bedside using Stratus to interpret (Spanish interpretor [phone]) The use of daily insulin injections, need for home glucose monitoring regularly, (especially initially and when changing dose increasing checks to tid-qid), and symptoms of potential hypoglycemia are discussed.  Patient was able return demonstrate use of insulin syringe. Also answered questions regarding nutrition and diabetes management.    Thank you, Nani Gasser. Ustin Cruickshank, RN, MSN, CDE  Diabetes Coordinator Inpatient Glycemic Control Team Team Pager [phone] (8am-5pm) 09/21/2018 9:22 AM

## 2018-09-21 NOTE — Progress Notes (Signed)
Pharmacy Antibiotic Note  Nancy Stephenson is a 58 y.o. female admitted on 09/18/2018 with concern for UTI/pyelo. Pharmacy has been consulted for to transition Rocephin to Meropenem.  The patient's urine cultures are growing 50k ESBL E.coli from 1/24. The plan is to transition the patient to Meropenem until it is confirmed that the blood cultures are clear. If clear - the plan would be to transition to po Bactrim to complete treatment.   Plan: - Meropenem 1g IV every 8 hours - Will continue to follow renal function, culture results, LOT, and antibiotic de-escalation plans   Height: 5' (152.4 cm) Weight: 105 lb 6.1 oz (47.8 kg) IBW/kg (Calculated) : 45.5  Temp (24hrs), Avg:99 F (37.2 C), Min:98.5 F (36.9 C), Max:99.5 F (37.5 C)  Recent Labs  Lab 09/18/18 1802 09/19/18 0526 09/21/18 0455  WBC 13.2* 11.6* 14.6*  CREATININE 0.70 0.70 0.79    Estimated Creatinine Clearance: 55.7 mL/min (by C-G formula based on SCr of 0.79 mg/dL).    Allergies  Allergen Reactions  . Fish Allergy Shortness Of Breath, Itching and Swelling  . Shellfish-Derived Products Shortness Of Breath, Itching and Swelling    Antimicrobials this admission: CTX 1/24 x 1; 1/26 >> 1/27 Fluconazole 1/25 x 1 Meropenem 1/27 >>  Dose adjustments this admission: n/a  Microbiology results: 1/24 UCx >> 50k ESBL E.coli  1/27 BCx >>  Thank you for allowing pharmacy to be a part of this patient's care.  Georgina Pillion, PharmD, BCPS Clinical Pharmacist Clinical phone for 09/21/2018: [phone] 09/21/2018 10:03 AM   **Pharmacist phone directory can now be found on [email or web address] ([email or web address]).  Listed under [facility].

## 2018-09-21 NOTE — Progress Notes (Signed)
PROGRESS NOTE    Nancy Stephenson  ZOX:[id number] DOB: 1960/10/18 DOA: 09/18/2018 PCP: Cain SaupeFulp, Cammie, MD  Outpatient Specialists:    Brief Narrative:  Nancy Stephenson is a 58 y.o. female with medical history significant for recently diagnosed type 2 diabetes who presents to the ED from her PCP office for hyperglycemia.  On presentation, the point-of-care glucose >600, A1c 14.9, and reported UTI symptoms.  Apparently, the patient was seen at the ER on 08/23/2018 for UTI and was treated with Keflex, but the UTI symptoms returned shortly afterwards.  Patient reported suprapubic discomfort, intermittent right-sided back pain.  No associated nausea or vomiting.  On admission, Labs notable for serum glucose 603, sodium 128 (140 when corrected for hyperglycemia), potassium 4.3, chloride 87, bicarb 25, BUN 17, creatinine 0.7, anion gap 16, WBC 13.2, hemoglobin 11.7, platelets 421, urinalysis showed > 500 glucose, negative ketones, moderate leukocytes, negative nitrites, rare bacteria.  I-STAT beta-hCG is negative.  CT abdomen/pelvis with contrast was obtained which showed finding suggestive of right-sided pyelonephritis.  Urine culture was collected and patient was started IV ceftriaxone.  She was given 1 L normal saline and started on IV insulin infusion.  The hospital service was consulted admit for further management.  09/19/2018: Patient continues to report suprapubic discomfort, and query right upper quadrant tenderness.  We will continue IV antibiotics.  We will also get a right upper quadrant ultrasound.  Will check LFTs.  Will optimize blood sugar control.  Further management will depend on hospital course.  09/20/2018: Patient seen.  Right upper quadrant ultrasound reveals normal gallbladder, but also reported fatty infiltrated liver.  Urine culture is growing E. coli, but final cultures are pending.  Ackley discharge home tomorrow once final cultures are known.  Good blood sugar control  is improving.  09/21/2018:09/21/2018: Urine culture is growing ESBL E. coli.  Will order blood culture.  Will DC IV Rocephin.  Will start patient on IV meropenem.  Further management will depend on hospital course.  Assessment & Plan:   Principal Problem:   Pyelonephritis Active Problems:   Type 2 diabetes mellitus with hyperglycemia, without long-term current use of insulin (HCC)  Possible right-sided pyelonephritis: -Continue IV ceftriaxone -Follow culture results. -Further management will depend on hospital course. -09/20/2018: Continue current antibiotics.  Follow final cultures. -09/21/2018:09/21/2018: Urine culture is growing ESBL E. coli.  Will order blood culture.  Will DC IV Rocephin.  Will start patient on IV meropenem.  Further management will depend on hospital course.  Right upper quadrant tenderness: -Ultrasound of the right upper quadrant. -Check LFTs. -Further management will depend on above.   -09/20/2018: Right upper quadrant ultrasound reveals normal gallbladder, but also reported fatty infiltrated liver.  Type 2 diabetes with hyperglycemia: -Recently diagnosed with A1c 14.9% on 09/18/2018.   -Continue long-acting insulin as well as sliding scale insulin coverage.   -Start metformin 1000 mg p.o. twice daily.   -Diabetes education.   -Consult diabetes navigator.   -Further management will depend on hospital course.  He has not been on treatment as an outpatient.   09/20/2018: Blood sugar control is improving. 09/21/2018: Blood sugar continues to improve.  HbA1c is 14.9%.  Continue to optimize.  DVT prophylaxis: Lovenox Code Status: Full code Family Communication: None present at bedside Disposition Plan:  Likely discharge back home. Consults called: None  Procedures:   None  Antimicrobials:   Rocephin IV discontinued  IV meropenem   Subjective: No new complaints.   No fever or chills.  Abdominal pain is improving.   Objective: Vitals:   09/20/18  2042 09/21/18 0507 09/21/18 0729 09/21/18 1750  BP: 104/62 105/63 110/70 119/62  Pulse: 83 76 81 75  Resp: 18 18 19 18   Temp: 99 F (37.2 C) 98.8 F (37.1 C) 98.5 F (36.9 C) 98.4 F (36.9 C)  TempSrc: Oral Oral Oral Oral  SpO2: 98% 98% 98% 100%  Weight:      Height:        Intake/Output Summary (Last 24 hours) at 09/21/2018 2021 Last data filed at 09/21/2018 1922 Gross per 24 hour  Intake 1770 ml  Output 3100 ml  Net -1330 ml   Filed Weights   09/19/18 0231 09/19/18 2042  Weight: 47.9 kg 47.8 kg    Examination:  General exam: Appears calm and comfortable  Respiratory system: Clear to auscultation.  Cardiovascular system: S1 & S2 heard. Gastrointestinal system: Abdomen is nondistended, soft with vague suprapubic discomfort.  Organs are difficult to assess.   Central nervous system: Alert and oriented. No focal neurological deficits. Extremities: No leg edema  Data Reviewed: I have personally reviewed following labs and imaging studies  CBC: Recent Labs  Lab 09/18/18 1802 09/19/18 0526 09/21/18 0455  WBC 13.2* 11.6* 14.6*  NEUTROABS  --   --  10.2*  HGB 11.7* 10.6* 11.3*  HCT 37.2 33.2* 34.9*  MCV 83.4 83.0 82.9  PLT 421* 366 346   Basic Metabolic Panel: Recent Labs  Lab 09/18/18 1802 09/19/18 0526 09/21/18 0455  NA 128* 134* 132*  K 4.3 3.7 3.8  CL 87* 99 96*  CO2 25 26 25   GLUCOSE 603* 386* 283*  BUN 17 13 16   CREATININE 0.70 0.70 0.79  CALCIUM 9.2 8.3* 8.6*  MG  --   --  1.7  PHOS  --   --  3.2   GFR: Estimated Creatinine Clearance: 55.7 mL/min (by C-G formula based on SCr of 0.79 mg/dL). Liver Function Tests: Recent Labs  Lab 09/19/18 1321 09/21/18 0455  AST 16  --   ALT 12  --   ALKPHOS 73  --   BILITOT 0.3  --   PROT 6.0*  --   ALBUMIN 2.7* 2.6*   No results for input(s): LIPASE, AMYLASE in the last 168 hours. No results for input(s): AMMONIA in the last 168 hours. Coagulation Profile: No results for input(s): INR, PROTIME in the  last 168 hours. Cardiac Enzymes: No results for input(s): CKTOTAL, CKMB, CKMBINDEX, TROPONINI in the last 168 hours. BNP (last 3 results) No results for input(s): PROBNP in the last 8760 hours. HbA1C: No results for input(s): HGBA1C in the last 72 hours. CBG: Recent Labs  Lab 09/20/18 1617 09/20/18 2041 09/21/18 0726 09/21/18 1122 09/21/18 1630  GLUCAP 331* 227* 263* 311* 198*   Lipid Profile: No results for input(s): CHOL, HDL, LDLCALC, TRIG, CHOLHDL, LDLDIRECT in the last 72 hours. Thyroid Function Tests: No results for input(s): TSH, T4TOTAL, FREET4, T3FREE, THYROIDAB in the last 72 hours. Anemia Panel: No results for input(s): VITAMINB12, FOLATE, FERRITIN, TIBC, IRON, RETICCTPCT in the last 72 hours. Urine analysis:    Component Value Date/Time   COLORURINE STRAW (A) 09/18/2018 1828   APPEARANCEUR HAZY (A) 09/18/2018 1828   LABSPEC 1.023 09/18/2018 1828   PHURINE 6.0 09/18/2018 1828   GLUCOSEU >=500 (A) 09/18/2018 1828   HGBUR SMALL (A) 09/18/2018 1828   BILIRUBINUR NEGATIVE 09/18/2018 1828   BILIRUBINUR negative 09/18/2018 1538   KETONESUR NEGATIVE 09/18/2018 1828   PROTEINUR NEGATIVE 09/18/2018  1828   UROBILINOGEN 0.2 09/18/2018 1538   NITRITE NEGATIVE 09/18/2018 1828   LEUKOCYTESUR MODERATE (A) 09/18/2018 1828   Sepsis Labs: @LABRCNTIP (procalcitonin:4,lacticidven:4)  ) Recent Results (from the past 240 hour(s))  Urine Culture     Status: Abnormal   Collection Time: 09/18/18  9:21 PM  Result Value Ref Range Status   Specimen Description URINE, CLEAN CATCH  Final   Special Requests   Final    NONE Performed at [facility], [address], [place], [place] [address]    Culture (A)  Final    50,000 COLONIES/mL ESCHERICHIA COLI Confirmed Extended Spectrum Beta-Lactamase Producer (ESBL).  In bloodstream infections from ESBL organisms, carbapenems are preferred over piperacillin/tazobactam. They are shown to have a lower risk of mortality.    Report  Status 09/21/2018 FINAL  Final   Organism ID, Bacteria ESCHERICHIA COLI (A)  Final      Susceptibility   Escherichia coli - MIC*    AMPICILLIN >=32 RESISTANT Resistant     CEFAZOLIN >=64 RESISTANT Resistant     CEFTRIAXONE >=64 RESISTANT Resistant     CIPROFLOXACIN >=4 RESISTANT Resistant     GENTAMICIN <=1 SENSITIVE Sensitive     IMIPENEM <=0.25 SENSITIVE Sensitive     NITROFURANTOIN <=16 SENSITIVE Sensitive     TRIMETH/SULFA <=20 SENSITIVE Sensitive     AMPICILLIN/SULBACTAM >=32 RESISTANT Resistant     PIP/TAZO 8 SENSITIVE Sensitive     Extended ESBL POSITIVE Resistant     * 50,000 COLONIES/mL ESCHERICHIA COLI         Radiology Studies: US Abdomen Limited Ruq  Result Date: 09/20/2018 CLINICAL DATA:  Abdominal pain EXAM: ULTRASOUND ABDOMEN LIMITED RIGHT UPPER QUADRANT COMPARISON:  None. FINDINGS: Gallbladder: No gallstones or wall thickening visualized. Small amount of sludge. No sonographic Murphy sign noted by sonographer. Common bile duct: Diameter: 4 mm Liver: Liver is diffusely echogenic indicating fatty infiltration. No focal liver abnormality. Portal vein is patent on color Doppler imaging with normal direction of blood flow towards the liver. IMPRESSION: 1. No acute findings. 2. Small amount of sludge within the gallbladder. No gallstones seen. No evidence of cholecystitis. 3. Fatty infiltration of the liver. Electronically Signed   By: Bary Richard M.D.   On: 09/20/2018 04:32        Scheduled Meds: . enoxaparin (LOVENOX) injection  40 mg Subcutaneous Q24H  . insulin aspart  0-15 Units Subcutaneous TID WC  . insulin aspart  0-5 Units Subcutaneous QHS  . insulin glargine  10 Units Subcutaneous BID  . metFORMIN  1,000 mg Oral BID WC   Continuous Infusions: . sodium chloride 250 mL (09/21/18 1207)  . meropenem (MERREM) IV       LOS: 2 days    Time spent: 25 minutes   Berton Mount, MD  [facility] Pager #: [phone] 7PM-7AM contact night  coverage as above

## 2018-09-22 ENCOUNTER — Encounter (HOSPITAL_COMMUNITY): Payer: Self-pay | Admitting: Pharmacist Clinician (PhC)/ Clinical Pharmacy Specialist

## 2018-09-22 LAB — CBC WITH DIFFERENTIAL/PLATELET
Abs Immature Granulocytes: 0.07 10*3/uL (ref 0.00–0.07)
Basophils Absolute: 0.1 10*3/uL (ref 0.0–0.1)
Basophils Relative: 0 %
Eosinophils Absolute: 0.2 10*3/uL (ref 0.0–0.5)
Eosinophils Relative: 2 %
HCT: 36.1 % (ref 36.0–46.0)
Hemoglobin: 11.3 g/dL — ABNORMAL LOW (ref 12.0–15.0)
Immature Granulocytes: 1 %
Lymphocytes Relative: 24 %
Lymphs Abs: 3.1 10*3/uL (ref 0.7–4.0)
MCH: 26 pg (ref 26.0–34.0)
MCHC: 31.3 g/dL (ref 30.0–36.0)
MCV: 83 fL (ref 80.0–100.0)
Monocytes Absolute: 1 10*3/uL (ref 0.1–1.0)
Monocytes Relative: 8 %
Neutro Abs: 8.7 10*3/uL — ABNORMAL HIGH (ref 1.7–7.7)
Neutrophils Relative %: 65 %
Platelets: 349 10*3/uL (ref 150–400)
RBC: 4.35 MIL/uL (ref 3.87–5.11)
RDW: 13.9 % (ref 11.5–15.5)
WBC: 13.1 10*3/uL — ABNORMAL HIGH (ref 4.0–10.5)
nRBC: 0 % (ref 0.0–0.2)

## 2018-09-22 LAB — GLUCOSE, CAPILLARY
GLUCOSE-CAPILLARY: 221 mg/dL — AB (ref 70–99)
Glucose-Capillary: 151 mg/dL — ABNORMAL HIGH (ref 70–99)

## 2018-09-22 MED ORDER — INSULIN GLARGINE 100 UNIT/ML ~~LOC~~ SOLN: 10.0000 [IU] | Freq: Two times a day (BID) | SUBCUTANEOUS | 11 refills | Status: DC

## 2018-09-22 MED ORDER — SULFAMETHOXAZOLE-TRIMETHOPRIM 800-160 MG PO TABS: 1.0000 | ORAL_TABLET | Freq: Two times a day (BID) | ORAL | Status: DC

## 2018-09-22 MED ORDER — ACCU-CHEK MULTICLIX LANCETS MISC: 5 refills | Status: DC

## 2018-09-22 MED ORDER — SULFAMETHOXAZOLE-TRIMETHOPRIM 800-160 MG PO TABS: 1.0000 | ORAL_TABLET | Freq: Two times a day (BID) | ORAL | 0 refills | Status: AC

## 2018-09-22 MED ORDER — BLOOD GLUCOSE MONITOR KIT: PACK | 0 refills | Status: AC

## 2018-09-22 MED ORDER — SULFAMETHOXAZOLE-TRIMETHOPRIM 800-160 MG PO TABS: 1.0000 | ORAL_TABLET | Freq: Two times a day (BID) | ORAL | 0 refills | Status: DC

## 2018-09-22 MED FILL — SULFAMETHOXAZOLE-TMP DS TAB: 800-160 | 9 days supply | Qty: 18 | Fill #0

## 2018-09-22 MED FILL — LANTUS 100 UNITS/ML VIAL: 100 | 28 days supply | Qty: 10 | Fill #0

## 2018-09-22 NOTE — Discharge Summary (Signed)
Physician Discharge Summary  Patient ID: Nancy Stephenson MRN: [id number] DOB/AGE: 1961/04/17 58 y.o.  Admit date: 09/18/2018 Discharge date: 09/22/2018  Admission Diagnoses:  Discharge Diagnoses:  Principal Problem:   Pyelonephritis Active Problems:   Type 2 diabetes mellitus with hyperglycemia, without long-term current use of insulin [facility])   Discharged Condition: stable  Hospital Course:  Nancy Stephenson 58 year old female with past medical history significant for recently diagnosed diabetes mellitus type 2.  Patient presented from her primary care provider's office with hyperglycemia.  On presentation, the point-of-care glucose was greater than 600, hemoglobin A1c was noted to be 14.9%, and patient reported symptoms suggestive of possible UTI.  Apparently, the patient was seen at the ER on 08/23/2018 for UTI and was treated with Keflex, but the UTI symptoms returned shortly afterwards.  Patient reported suprapubic discomfort, intermittent right-sided back pain.  Patient was admitted for further assessment and management.  Detailed lab work on presentation revealed serum glucose of 603, sodium of 128, potassium 4.3, chloride 87, bicarb 25, BUN 17, creatinine 0.7, anion gap 16, WBC 13.2, hemoglobin 11.7, platelets 421, urinalysis showed>500 glucose, negative ketones, moderate leukocytes, negative nitrites, rare bacteria.  CT abdomen/pelvis with contrast was obtained which showed finding suggestive of right-sided pyelonephritis. Urine culture was collected and patient was started IV ceftriaxone. Right upper quadrant ultrasound reveals normal gallbladder, but also reported fatty infiltrated liver.  Urine culture grew ESBL E. Coli.  IV Rocephin was changed to IV meropenem.  Based on urine culture results, patient was discharged back home on Bactrim.  Patient will follow with the primary care provider on completion of antibiotics.  PCP may consider repeating urinalysis and urine  culture.  Possible right-sided pyelonephritis: Patient was initially treated with IV Rocephin. However, final urine culture revealed ESBL E. Coli. IV Rocephin was changed to IV meropenem. Based on urine culture results, patient was discharged on Bactrim. Primary care provider may consider repeating urinalysis and urine culture on completion of antibiotics.  Right upper quadrant tenderness: -Ultrasound of the right upper quadrant ultrasound reveals normal gallbladder, but also reported fatty infiltrated liver. -PCP will optimize lipid control. -Follow LFTs, monitor liver function.  Type 2 diabetes with hyperglycemia: -Recently diagnosed with A1c 14.9% on 09/18/2018.  -Continued long-acting insulin as well as sliding scale insulin coverage.   -Started metformin 1000 mg p.o. twice daily.   -Diabetes education.   -Consulted diabetes navigator.   -PCP will continue to monitor blood sugar control on outpatient basis.  Consults: None  Significant Diagnostic Studies:    Discharge Exam: Blood pressure 101/67, pulse 77, temperature 98.5 F (36.9 C), temperature source Oral, resp. rate 20, height 5' (1.524 m), weight 50.1 kg, SpO2 98 %.  Disposition: Discharge disposition: 01-Home or Self Care   Discharge Instructions    Diet - low sodium heart healthy   Complete by:  As directed    Discharge instructions   Complete by:  As directed    Check blood sugar 4 times daily, document readings and review readings with your PCP within 1 week of discharge. Call your MD for blood sugar greater than 280 or less than 100.   Increase activity slowly   Complete by:  As directed      Allergies as of 09/22/2018      Reactions   Fish Allergy Shortness Of Breath, Itching, Swelling   Shellfish-derived Products Shortness Of Breath, Itching, Swelling      Medication List    STOP taking these medications  ciprofloxacin 500 MG tablet Commonly known as:  CIPRO   ibuprofen 200 MG tablet Commonly  known as:  ADVIL,MOTRIN     TAKE these medications   glipiZIDE 5 MG tablet Commonly known as:  GLUCOTROL Take 1 tablet (5 mg total) by mouth 2 (two) times daily before Stephenson meal.   glucose blood test strip Commonly known as:  TRUE METRIX BLOOD GLUCOSE TEST Use as instructed to check morning blood sugar before eating, 2 hours after largest meal of the day and before bedtime   insulin glargine 100 UNIT/ML injection Commonly known as:  LANTUS Inject 0.1 mLs (10 Units total) into the skin 2 (two) times daily.   metFORMIN 1000 MG tablet Commonly known as:  GLUCOPHAGE Take 1 tablet (1,000 mg total) by mouth 2 (two) times daily after Stephenson meal.   sulfamethoxazole-trimethoprim 800-160 MG tablet Commonly known as:  BACTRIM DS,SEPTRA DS Take 1 tablet by mouth every 12 (twelve) hours for 9 days.   TRUE METRIX AIR GLUCOSE METER w/Device Kit 1 kit by Does not apply route 4 (four) times daily -  before meals and at bedtime.   TRUEPLUS LANCETS 28G Misc Use to check blood sugar 3 times per day        Signed: Sylvester I Ogbata 09/22/2018, 12:57 PM  

## 2018-09-22 NOTE — Progress Notes (Addendum)
Inpatient Diabetes Program Recommendations  AACE/ADA: New Consensus Statement on Inpatient Glycemic Control (2015)  Target Ranges:  Prepandial:   less than 140 mg/dL      Peak postprandial:   less than 180 mg/dL (1-2 hours)      Critically ill patients:  140 - 180 mg/dL   Lab Results  Component Value Date   GLUCAP 151 (H) 09/22/2018   HGBA1C 14.9 (A) 09/18/2018    Review of Glycemic Control Results for Nancy Stephenson, Nancy Stephenson (MRN [id number]) as of 09/22/2018 10:25  Ref. Range 09/21/2018 11:22 09/21/2018 16:30 09/21/2018 20:56 09/22/2018 07:39  Glucose-Capillary Latest Ref Range: 70 - 99 mg/dL 284 (H) 132 (H) 440 (H) 151 (H)   Diabetes history: Type 2 DM Outpatient Diabetes medications: Metformin 1000 mg BID Current orders for Inpatient glycemic control: Novolog 0-15 units TID, Novolog 0-5 units QHS, Lantus 10 units BID, Metformin 1000 mg BID  Inpatient Diabetes Program Recommendations:    - Increase Lantus to 12 units bid. -Novolog 6 units tid (if patient eats 50%).  Addendum@1430 : Spoke with patient via [employer] interpreters. Reviewed AVS, scripts, and when to administer antidiabetic medications with patient. Educated patient on survival skills, interventions, when to call MD. Stressed the importance of following up at [facility]. Patient has an appointment on the 6th and plans to go. Encouraged to take CBG log so that provider can make needed adjustments. Patient has no further questions.   Thanks, Lujean Rave, MSN, RNC-OB Diabetes Coordinator [phone] (8a-5p)

## 2018-09-22 NOTE — Progress Notes (Signed)
Patient discharged to home. Patient AVS reviewed and signed. Patient capable re-verbalizing medications and follow-up appointments. IV removed. Patient belongings sent with patient. Patient educated to return to the ED in the event of SOB, chest pain or dizziness.   Saren Corkern B, RN 

## 2018-09-22 NOTE — Care Management Note (Signed)
Case Management Note Hortencia Conradi, RN MSN CCM Transitions of Care [address] [place] [phone]  Patient Details  Name: Nancy Stephenson MRN: [id number] Date of Birth: 1961/08/07  Subjective/Objective:                Pyelonephritis      Action/Plan: PTA home alone. Independent. Received consult for medication needs. No insurance. Pt is followed by [facility]-hospital f/u 10/02/2018 at 10:50 am. Provided patient with Match letter. Video language interpreter used to discuss prescriptions and f/u appointment with patient. No other transition of care needs identified.   Expected Discharge Date:  09/22/18               Expected Discharge Plan:  Home/Self Care  In-House Referral:  NA  Discharge planning Services  CM Consult, Medication Assistance  Post Acute Care Choice:  NA Choice offered to:  NA  DME Arranged:  N/A DME Agency:  NA  HH Arranged:  NA HH Agency:  NA  Status of Service:  Completed, signed off  If discussed at Long Length of Stay Meetings, dates discussed:    Additional Comments:  Bess Kinds, RN 09/22/2018, 3:14 PM

## 2018-09-22 NOTE — Progress Notes (Addendum)
Pharmacy Antibiotic Note  Nancy Stephenson is a 58 y.o. female admitted on 09/18/2018 with concern for UTI/pyelo. Pharmacy has been consulted for to transition Rocephin to Meropenem.  The patient's urine cultures are growing 50k ESBL E.coli from 1/24. The plan is to transition the patient to Meropenem until it is confirmed that the blood cultures are clear. If clear - the plan would be to transition to po Bactrim to complete treatment.   Blood cultures remain negative and pt is taking PO meds. D/w with Dr. Dartha Lodge today to change to PO bactrim to complete 10d for pyelo.   Plan: Dc merrem Septra DS 1 BID x9 more days  Height: 5' (152.4 cm) Weight: 110 lb 7.2 oz (50.1 kg) IBW/kg (Calculated) : 45.5  Temp (24hrs), Avg:98.7 F (37.1 C), Min:98.4 F (36.9 C), Max:99.6 F (37.6 C)  Recent Labs  Lab 09/18/18 1802 09/19/18 0526 09/21/18 0455 09/22/18 0508  WBC 13.2* 11.6* 14.6* 13.1*  CREATININE 0.70 0.70 0.79  --     Estimated Creatinine Clearance: 55.7 mL/min (by C-G formula based on SCr of 0.79 mg/dL).    Allergies  Allergen Reactions  . Fish Allergy Shortness Of Breath, Itching and Swelling  . Shellfish-Derived Products Shortness Of Breath, Itching and Swelling    Antimicrobials this admission: CTX 1/24 x 1; 1/26 >> 1/27 Fluconazole 1/25 x 1 Meropenem 1/27 >>1/28 Septra 1/28>>  Dose adjustments this admission: n/a  Microbiology results: 1/24 UCx >> 50k ESBL E.coli  1/27 BCx >>2/3  Ulyses Southward, PharmD, [place], AAHIVP, CPP Infectious Disease Pharmacist 09/22/2018 12:41 PM

## 2018-09-26 LAB — CULTURE, BLOOD (ROUTINE X 2)
Culture: NO GROWTH
Culture: NO GROWTH
Special Requests: ADEQUATE
Special Requests: ADEQUATE

## 2018-10-02 ENCOUNTER — Encounter: Payer: Self-pay | Admitting: Family Medicine

## 2018-10-02 ENCOUNTER — Ambulatory Visit: Payer: Self-pay | Attending: Family Medicine | Admitting: Family Medicine

## 2018-10-02 VITALS — BP 121/76 | HR 82 | Temp 98.4°F | Resp 18 | Ht 60.0 in | Wt 113.0 lb

## 2018-10-02 DIAGNOSIS — E1165 Type 2 diabetes mellitus with hyperglycemia: Secondary | ICD-10-CM

## 2018-10-02 DIAGNOSIS — B001 Herpesviral vesicular dermatitis: Secondary | ICD-10-CM

## 2018-10-02 DIAGNOSIS — N3091 Cystitis, unspecified with hematuria: Secondary | ICD-10-CM

## 2018-10-02 DIAGNOSIS — Z09 Encounter for follow-up examination after completed treatment for conditions other than malignant neoplasm: Secondary | ICD-10-CM

## 2018-10-02 DIAGNOSIS — N12 Tubulo-interstitial nephritis, not specified as acute or chronic: Secondary | ICD-10-CM

## 2018-10-02 LAB — POCT URINALYSIS DIP (CLINITEK)
Bilirubin, UA: NEGATIVE
Glucose, UA: 250 mg/dL — AB
Ketones, POC UA: NEGATIVE mg/dL
Nitrite, UA: POSITIVE — AB
POC PROTEIN,UA: 30 — AB
Spec Grav, UA: <=1.005 — AB
Urobilinogen, UA: 0.2 U/dL
pH, UA: 6

## 2018-10-02 MED ORDER — ACYCLOVIR 400 MG PO TABS: 400.0000 mg | ORAL_TABLET | Freq: Three times a day (TID) | ORAL | 3 refills | Status: DC

## 2018-10-02 MED ORDER — ACYCLOVIR 400 MG PO TABS: 400.0000 mg | ORAL_TABLET | Freq: Three times a day (TID) | ORAL | 3 refills | Status: AC

## 2018-10-02 MED ORDER — NITROFURANTOIN MONOHYD MACRO 100 MG PO CAPS: 100.0000 mg | ORAL_CAPSULE | Freq: Two times a day (BID) | ORAL | 0 refills | Status: DC

## 2018-10-02 MED ORDER — GLUCOSE BLOOD VI STRP: ORAL_STRIP | 12 refills | Status: DC

## 2018-10-02 MED ORDER — INSULIN GLARGINE 100 UNIT/ML ~~LOC~~ SOLN: 25.0000 [IU] | Freq: Every day | SUBCUTANEOUS | 4 refills | Status: DC

## 2018-10-02 MED FILL — TRUE METRIX TEST STRIP: 30 days supply | Qty: 100 | Fill #0

## 2018-10-02 MED FILL — NITROFURANTOIN MONO-MCR 100: 100 | 7 days supply | Qty: 14 | Fill #0

## 2018-10-02 NOTE — Progress Notes (Signed)
Subjective:    Patient ID: Nancy Stephenson, female    DOB: [birth year], 58 y.o.   MRN: [id number]  HPI       58 yo female who is seen status post hospitalization for diabetes with hyperglycemia and patient also diagnosed with pyelonephritis.  Patient was hospitalized from 09/18/2018 through 09/22/2018.  Patient had CT scan done in the emergency department which showed right-sided pyelonephritis and patient was started on IV ceftriaxone and patient's hyperglycemia (glucose 603) was treated with 1 L of normal saline and IV insulin infusion and patient was admitted for further evaluation.      Patient reports that she did not have any significant symptoms suggestive of urinary tract infection at the time of her hospitalization.  She does have some mild right-sided mid back pain at today's visit that is about a 4 on a 0-to-10 scale with 10 being the worst possible pain.  Patient denies any dysuria.  She denies any fever or chills.  Patient did have some right upper quadrant pain during her hospitalization as she states that she was told that there were no issues with her gallbladder but she did have some fatty infiltration of her liver.  She states that her blood sugars have improved during her hospitalization as she is using the Lantus but needs medication refills.  She has some mild urinary frequency and mild increased thirst.  She feels that her appetite is back to normal.  She still has some mild fatigue.  Patient also has complaint of history of recurrent cold sores and she is starting to have some breakouts and would like to have medication for treatment. Past Medical History:  Diagnosis Date  . Diabetes mellitus without complication ([place])   . Pre-diabetes    Past Surgical History:  Procedure Laterality Date  . CESAREAN SECTION     Family History  Problem Relation Age of Onset  . Hypertension Sister   . Hypertension Brother    Social History   Tobacco Use  . Smoking status: Never  Smoker  . Smokeless tobacco: Never Used  Substance Use Topics  . Alcohol use: Never    Frequency: Never  . Drug use: Never   Allergies  Allergen Reactions  . Fish Allergy Shortness Of Breath, Itching and Swelling  . Shellfish-Derived Products Shortness Of Breath, Itching and Swelling     Review of Systems  Constitutional: Positive for fatigue (Mild).  HENT: Negative for sore throat and trouble swallowing.   Eyes: Negative for photophobia and visual disturbance.  Respiratory: Negative for cough and shortness of breath.   Cardiovascular: Negative for chest pain, palpitations and leg swelling.  Gastrointestinal: Negative for abdominal pain, constipation, diarrhea and nausea.  Endocrine: Positive for polydipsia (Mild) and polyuria (Mild). Negative for polyphagia.  Genitourinary: Positive for frequency (Mild). Negative for dysuria.  Musculoskeletal: Positive for back pain. Negative for arthralgias.  Neurological: Negative for dizziness and headaches.  Hematological: Negative for adenopathy. Does not bruise/bleed easily.       Objective:   Physical Exam BP 121/76 (BP Location: Left Arm, Patient Position: Sitting, Cuff Size: Normal)   Pulse 82   Temp 98.4 F (36.9 C) (Oral)   Resp 18   Ht 5' (1.524 m)   Wt 113 lb (51.3 kg)   SpO2 100%   BMI 22.07 kg/m Nurse's notes and vital signs reviewed General- well-nourished, well-developed small framed older female in no acute distress ENT-TMs gray, nares with mild edema of the nasal turbinates, patient with  mild posterior pharynx erythema.  Patient with presence of fever blisters/cold sores on the upper and lower lips Neck-supple, no lymphadenopathy Lungs-clear to auscultation bilaterally Cardiovascular-regular rate and rhythm Abdomen- soft, normal bowel sounds, nontender Back-right CVA tenderness mild lumbosacral paraspinous spasm Extremities-no edema      Assessment & Plan:  1. Type 2 diabetes mellitus with hyperglycemia, without  long-term current use of insulin ([place]); 5.  Hospital discharge follow-up Discussed with patient that her elevated blood sugars with her newly diagnosed diabetes was likely partially related to her ongoing infection.  When patient was here in the office on 09/18/2018 she had point-of-care glucose greater than 600 and A1c of 14.9 and abnormal urinalysis indicating urinary tract infection and patient was asked to go to the emergency department for further evaluation.  She was given Lantus as well as short acting insulin and started on metformin to help with her blood sugars.  Per discharge summary she is also to take Glucotrol 5 mg twice daily.  Patient also had treatment for urinary tract infection.  Patient will have repeat BMP.  Hospital discharge summary as well as history and physical upon admission and imaging/labs from hospitalization reviewed.  Urinalysis also repeated at today's visit.  Patient was also given additional refills for blood glucose testing strips. - Glucose (CBG) - POCT URINALYSIS DIP (CLINITEK) - Basic Metabolic Panel - glucose blood (TRUE METRIX BLOOD GLUCOSE TEST) test strip; Use as instructed to check morning blood sugar before eating, 2 hours after largest meal of the day and before bedtime  Dispense: 100 each; Refill: 12 - insulin glargine (LANTUS) 100 UNIT/ML injection; Inject 0.25 mLs (25 Units total) into the skin daily.  Dispense: 10 mL; Refill: 4  2. Pyelonephritis of right kidney Patient status post hospitalization for diabetes with hyperglycemia and patient was also found to have right pyelonephritis and cystitis.  Patient was treated with Bactrim however she continues to have some symptoms of back pain and lower abdominal discomfort as well as frequent urination.  Patient's hospital notes reviewed and patient had a urine culture with sensitivity to Macrobid.  Patient will be placed on Macrobid while urine culture is pending - nitrofurantoin, macrocrystal-monohydrate,  (MACROBID) 100 MG capsule; Take 1 capsule (100 mg total) by mouth 2 (two) times daily for 7 days.  Dispense: 14 capsule; Refill: 0 - CBC with Differential  3. Cystitis with hematuria Patient with continued abnormal urinalysis with presence of leukocytes, nitrites and blood.  Patient had urine culture done during hospitalization and patient had growth of E. coli which was sensitive to [place].  Patient will be placed on Macrobid 100 mg twice daily x7 days.  Patient has been asked to follow-up in the next 1 to 2 weeks for repeat urinalysis.  Patient will also have CBC as patient had elevated white blood cell count at the time of discharge. - nitrofurantoin, macrocrystal-monohydrate, (MACROBID) 100 MG capsule; Take 1 capsule (100 mg total) by mouth 2 (two) times daily for 7 days.  Dispense: 14 capsule; Refill: 0 - CBC with Differential -Urine culture  4. Recurrent cold sores Prescription provided for acyclovir for treatment of recurrent cold sores - acyclovir (ZOVIRAX) 400 MG tablet; Take 1 tablet (400 mg total) by mouth 3 (three) times daily for 5 days. To treat cold sores  Dispense: 15 tablet; Refill: 3  An After Visit Summary was printed and given to the patient.  Allergies as of 10/02/2018      Reactions   Fish Allergy Shortness Of Breath, Itching,  Swelling   Shellfish-derived Products Shortness Of Breath, Itching, Swelling      Medication List       Accurate as of October 02, 2018 11:59 PM. Always use your most recent med list.        acyclovir 400 MG tablet Commonly known as:  ZOVIRAX Take 1 tablet (400 mg total) by mouth 3 (three) times daily for 5 days. To treat cold sores   blood glucose meter kit and supplies Kit Dispense based on patient and insurance preference. Use up to four times daily as directed. (FOR ICD-9 250.00, 250.01).   glipiZIDE 5 MG tablet Commonly known as:  GLUCOTROL Take 1 tablet (5 mg total) by mouth 2 (two) times daily before a meal.   glucose blood  test strip Commonly known as:  True Metrix Blood Glucose Test Use as instructed to check morning blood sugar before eating, 2 hours after largest meal of the day and before bedtime   insulin glargine 100 UNIT/ML injection Commonly known as:  LANTUS Inject 0.25 mLs (25 Units total) into the skin daily.   metFORMIN 1000 MG tablet Commonly known as:  GLUCOPHAGE Take 1 tablet (1,000 mg total) by mouth 2 (two) times daily after a meal.   nitrofurantoin (macrocrystal-monohydrate) 100 MG capsule Commonly known as:  Macrobid Take 1 capsule (100 mg total) by mouth 2 (two) times daily for 7 days.   True Metrix Air Glucose Meter w/Device Kit 1 kit by Does not apply route 4 (four) times daily -  before meals and at bedtime.   TRUEplus Lancets 28G Misc Use to check blood sugar 3 times per day   accu-chek multiclix lancets Use as directed      Return in about 1 week (around 10/09/2018) for DM/UTI.

## 2018-10-03 LAB — CBC WITH DIFFERENTIAL/PLATELET
Basophils Absolute: 0.1 x10E3/uL (ref 0.0–0.2)
Basos: 1 %
EOS (ABSOLUTE): 0.2 x10E3/uL (ref 0.0–0.4)
Eos: 2 %
Hematocrit: 32 % — ABNORMAL LOW (ref 34.0–46.6)
Hemoglobin: 10.5 g/dL — ABNORMAL LOW (ref 11.1–15.9)
Immature Grans (Abs): 0.2 x10E3/uL — ABNORMAL HIGH (ref 0.0–0.1)
Immature Granulocytes: 1 %
Lymphocytes Absolute: 2.8 x10E3/uL (ref 0.7–3.1)
Lymphs: 25 %
MCH: 26.6 pg (ref 26.6–33.0)
MCHC: 32.8 g/dL (ref 31.5–35.7)
MCV: 81 fL (ref 79–97)
Monocytes Absolute: 0.9 x10E3/uL (ref 0.1–0.9)
Monocytes: 8 %
Neutrophils Absolute: 6.9 x10E3/uL (ref 1.4–7.0)
Neutrophils: 63 %
Platelets: 422 x10E3/uL (ref 150–450)
RBC: 3.95 x10E6/uL (ref 3.77–5.28)
RDW: 13.6 % (ref 11.7–15.4)
WBC: 11 x10E3/uL — ABNORMAL HIGH (ref 3.4–10.8)

## 2018-10-03 LAB — BASIC METABOLIC PANEL WITH GFR
BUN/Creatinine Ratio: 16 (ref 9–23)
BUN: 14 mg/dL (ref 6–24)
CO2: 23 mmol/L (ref 20–29)
Calcium: 9 mg/dL (ref 8.7–10.2)
Chloride: 89 mmol/L — ABNORMAL LOW (ref 96–106)
Creatinine, Ser: 0.89 mg/dL (ref 0.57–1.00)
GFR calc Af Amer: 83 mL/min/1.73
GFR calc non Af Amer: 72 mL/min/1.73
Glucose: 255 mg/dL — ABNORMAL HIGH (ref 65–99)
Potassium: 4.4 mmol/L (ref 3.5–5.2)
Sodium: 130 mmol/L — ABNORMAL LOW (ref 134–144)

## 2018-10-05 MED FILL — ACYCLOVIR 400 MG TABLET: 400 | 5 days supply | Qty: 15 | Fill #0

## 2018-10-06 ENCOUNTER — Telehealth: Payer: Self-pay | Admitting: *Deleted

## 2018-10-06 NOTE — Telephone Encounter (Signed)
-----   Message from Cain Saupe, MD sent at 10/03/2018  7:21 PM EST ----- .Please notify patient that on her recent blood work her glucose was 255 and sodium was low at 132. Sodium level needs to be rechecked at her next visit. CBC with a mild increase in her white blood cell count which is likely due to her continued urinary tract infection but WBC count is improved from 13.1 on 09/22/18. Anemia with Hgb of 10.5

## 2018-10-06 NOTE — Telephone Encounter (Signed)
Medical Assistant used [employer] to contact patient.  Interpreter Name: Lenord Fellers Interpreter #: [id number] Patient verified DOB Patient is aware of elevated glucose and low sodium. Patient should adhere to DM medications to address elevated sugar levels. Patient is aware of WBC being elevated most likely related to the UTI. Patient is aware of needing an Iron supplement and will discuss at her visit this week.

## 2018-10-08 ENCOUNTER — Ambulatory Visit: Payer: Self-pay | Attending: Family Medicine | Admitting: Physician Assistant

## 2018-10-08 VITALS — BP 124/80 | HR 78 | Temp 98.8°F | Resp 16 | Ht 60.0 in | Wt 110.6 lb

## 2018-10-08 DIAGNOSIS — Z79899 Other long term (current) drug therapy: Secondary | ICD-10-CM | POA: Insufficient documentation

## 2018-10-08 DIAGNOSIS — Z789 Other specified health status: Secondary | ICD-10-CM

## 2018-10-08 DIAGNOSIS — Z794 Long term (current) use of insulin: Secondary | ICD-10-CM | POA: Insufficient documentation

## 2018-10-08 DIAGNOSIS — D649 Anemia, unspecified: Secondary | ICD-10-CM | POA: Insufficient documentation

## 2018-10-08 DIAGNOSIS — H1011 Acute atopic conjunctivitis, right eye: Secondary | ICD-10-CM | POA: Insufficient documentation

## 2018-10-08 DIAGNOSIS — E1165 Type 2 diabetes mellitus with hyperglycemia: Secondary | ICD-10-CM | POA: Insufficient documentation

## 2018-10-08 DIAGNOSIS — Z91013 Allergy to seafood: Secondary | ICD-10-CM | POA: Insufficient documentation

## 2018-10-08 DIAGNOSIS — N3001 Acute cystitis with hematuria: Secondary | ICD-10-CM | POA: Insufficient documentation

## 2018-10-08 DIAGNOSIS — N3091 Cystitis, unspecified with hematuria: Secondary | ICD-10-CM

## 2018-10-08 LAB — GLUCOSE, POCT (MANUAL RESULT ENTRY): POC Glucose: 204 mg/dl — AB (ref 70–99)

## 2018-10-08 LAB — POCT URINALYSIS DIP (CLINITEK)
Bilirubin, UA: NEGATIVE
Glucose, UA: NEGATIVE mg/dL
Ketones, POC UA: NEGATIVE mg/dL
Nitrite, UA: NEGATIVE
POC PROTEIN,UA: 100 — AB
Spec Grav, UA: 1.01 (ref 1.010–1.025)
Urobilinogen, UA: 0.2 E.U./dL
pH, UA: 7 (ref 5.0–8.0)

## 2018-10-08 MED ORDER — FERROUS SULFATE 325 (65 FE) MG PO TABS: 325.0000 mg | ORAL_TABLET | Freq: Every day | ORAL | 1 refills | Status: DC

## 2018-10-08 MED ORDER — OLOPATADINE HCL 0.1 % OP SOLN: 1.0000 [drp] | Freq: Two times a day (BID) | OPHTHALMIC | 12 refills | Status: DC

## 2018-10-08 MED ORDER — NITROFURANTOIN MONOHYD MACRO 100 MG PO CAPS: 100.0000 mg | ORAL_CAPSULE | Freq: Two times a day (BID) | ORAL | 0 refills | Status: AC

## 2018-10-08 MED ORDER — INSULIN SYRINGES (DISPOSABLE) U-100 0.3 ML MISC: 1 refills | Status: DC

## 2018-10-08 MED ORDER — FLUCONAZOLE 150 MG PO TABS: ORAL_TABLET | ORAL | 0 refills | Status: DC

## 2018-10-08 MED FILL — TRUEPLUS SYR 0.3ML 31GX5/16: 31G X 5/16" | 25 days supply | Qty: 100 | Fill #0

## 2018-10-08 MED FILL — FERROUS SULFATE 325 MG TAB: 325 (65 FE) | 30 days supply | Qty: 30 | Fill #0

## 2018-10-08 MED FILL — OLOPATADINE HCL 0.1% EYE DR: 0.1 | 38 days supply | Qty: 5 | Fill #0

## 2018-10-08 MED FILL — FLUCONAZOLE 150 MG TABS: 150 | 8 days supply | Qty: 2 | Fill #0

## 2018-10-08 MED FILL — NITROFURANTOIN MONO-MCR 100: 100 | 7 days supply | Qty: 14 | Fill #0

## 2018-10-08 NOTE — Progress Notes (Signed)
Patient ID: Nancy Stephenson, female   DOB: 02/04/1961, 58 y.o.   MRN: [id number]   [name], is a 58 y.o. female  [id number]:[id number]  [id number]  DOB - 05/15/1961  Subjective:  Chief Complaint and HPI: Nancy Stephenson is a 58 y.o. female here for multiple issues.  Needs recheck on recent Pyelo and UTI.  No urinary S/sx.  No flank pain. Finishes antibiotics last night.  No vaginal discharge or abdominal pain.   Blood sugars improving.  Needs insulin syringes.    She was told she needed to start iron for anemia and needs RX.    Also c/o R eye itching and watering.  No vision change.  No eye redness or pain.  This has been going on for 2 days.  No URI s/sx.    ROS:   Constitutional:  No f/c, No night sweats, No unexplained weight loss. EENT:  No vision changes, No blurry vision, No hearing changes. No mouth, throat, or ear problems.  Respiratory: No cough, No SOB Cardiac: No CP, no palpitations GI:  No abd pain, No N/V/D. GU: No Urinary s/sx Musculoskeletal: No joint pain Neuro: No headache, no dizziness, no motor weakness.  Skin: No rash Endocrine:  No polydipsia. No polyuria.  Psych: Denies SI/HI  No problems updated.  ALLERGIES: Allergies  Allergen Reactions  . Fish Allergy Shortness Of Breath, Itching and Swelling  . Shellfish-Derived Products Shortness Of Breath, Itching and Swelling    PAST MEDICAL HISTORY: Past Medical History:  Diagnosis Date  . Diabetes mellitus without complication ([place])   . Pre-diabetes     MEDICATIONS AT HOME: Prior to Admission medications   Medication Sig Start Date End Date Taking? Authorizing Provider  blood glucose meter kit and supplies KIT Dispense based on patient and insurance preference. Use up to four times daily as directed. (FOR ICD-9 250.00, 250.01). 09/22/18  Yes Bonnell Public, MD  Blood Glucose Monitoring Suppl (TRUE METRIX AIR GLUCOSE METER) w/Device KIT 1 kit by Does not apply route 4 (four)  times daily -  before meals and at bedtime. 09/18/18  Yes Fulp, Cammie, MD  glucose blood (TRUE METRIX BLOOD GLUCOSE TEST) test strip Use as instructed to check morning blood sugar before eating, 2 hours after largest meal of the day and before bedtime 10/02/18  Yes Fulp, Cammie, MD  insulin glargine (LANTUS) 100 UNIT/ML injection Inject 0.25 mLs (25 Units total) into the skin daily. 10/02/18  Yes Fulp, Cammie, MD  Lancets (ACCU-CHEK MULTICLIX) lancets Use as directed 09/22/18  Yes Bonnell Public, MD  nitrofurantoin, macrocrystal-monohydrate, (MACROBID) 100 MG capsule Take 1 capsule (100 mg total) by mouth 2 (two) times daily for 7 days. 10/08/18 10/15/18 Yes Argentina Donovan, PA-C  TRUEPLUS LANCETS 28G MISC Use to check blood sugar 3 times per day 09/18/18  Yes Fulp, Cammie, MD  ferrous sulfate 325 (65 FE) MG tablet Take 1 tablet (325 mg total) by mouth daily with breakfast. 10/08/18   Argentina Donovan, PA-C  fluconazole (DIFLUCAN) 150 MG tablet Take 1 now and again in 1 week for yeast infection 10/08/18   Argentina Donovan, PA-C  glipiZIDE (GLUCOTROL) 5 MG tablet Take 1 tablet (5 mg total) by mouth 2 (two) times daily before a meal. Patient not taking: Reported on 10/08/2018 09/18/18   Antony Blackbird, MD  Insulin Syringes, Disposable, U-100 0.3 ML MISC Use daily for lantus injections 10/08/18   Freeman Caldron M, PA-C  metFORMIN (GLUCOPHAGE) 1000 MG tablet  Take 1 tablet (1,000 mg total) by mouth 2 (two) times daily after a meal. Patient not taking: Reported on 10/08/2018 09/18/18   Fulp, Cammie, MD  olopatadine (PATANOL) 0.1 % ophthalmic solution Place 1 drop into the right eye 2 (two) times daily. 10/08/18   Argentina Donovan, PA-C     Objective:  EXAM:   Vitals:   10/08/18 0935  BP: 124/80  Pulse: 78  Resp: 16  Temp: 98.8 F (37.1 C)  TempSrc: Oral  SpO2: 97%  Weight: 110 lb 9.6 oz (50.2 kg)  Height: 5' (1.524 m)    General appearance : A&OX3. NAD. Non-toxic-appearing HEENT: Atraumatic and  Normocephalic.  PERRLA. EOM intact. R eye with increased lacrimation and mild chemosis.  No erythema. TM clear B. Mouth-MMM, post pharynx WNL w/o erythema, No PND. Neck: supple, no JVD. No cervical lymphadenopathy. No thyromegaly Chest/Lungs:  Breathing-non-labored, Good air entry bilaterally, breath sounds normal without rales, rhonchi, or wheezing  CVS: S1 S2 regular, no murmurs, gallops, rubs  No CVA TTP Extremities: Bilateral Lower Ext shows no edema, both legs are warm to touch with = pulse throughout Neurology:  CN II-XII grossly intact, Non focal.   Psych:  TP linear. J/I WNL. Normal speech. Appropriate eye contact and affect.  Skin:  No Rash  Data Review Lab Results  Component Value Date   HGBA1C 14.9 (A) 09/18/2018     Assessment & Plan   1. Allergic conjunctivitis of right eye - olopatadine (PATANOL) 0.1 % ophthalmic solution; Place 1 drop into the right eye 2 (two) times daily.  Dispense: 5 mL; Refill: 12  2. Acute cystitis with hematuria UA still shows infection-will add longer macrobid treatment according to last C&S - POCT URINALYSIS DIP (CLINITEK) - Urine cytology ancillary only - Urine Culture  3. Type 2 diabetes mellitus with hyperglycemia, without long-term current use of insulin (HCC) Uncontrolled but improving.  Eliminate sugars.  Continue with medication regimen.  Compliance stressed - Glucose (CBG)  4. Cystitis with hematuria UA still shows infection-will add longer macrobid treatment according to last C&S.  Increase water intake - nitrofurantoin, macrocrystal-monohydrate, (MACROBID) 100 MG capsule; Take 1 capsule (100 mg total) by mouth 2 (two) times daily for 7 days.  Dispense: 14 capsule; Refill: 0 - fluconazole (DIFLUCAN) 150 MG tablet; Take 1 now and again in 1 week for yeast infection  Dispense: 2 tablet; Refill: 0  5. Anemia, unspecified type - ferrous sulfate 325 (65 FE) MG tablet; Take 1 tablet (325 mg total) by mouth daily with breakfast.   Dispense: 90 tablet; Refill: 1  6. Language barrier [employer] interpreters used and additional time performing visit was required. Tatiana translated.       Patient have been counseled extensively about nutrition and exercise  Return in about 6 weeks (around 11/19/2018) for Dr Chapman Fitch for DM.  The patient was given clear instructions to go to ER or return to medical center if symptoms don't improve, worsen or new problems develop. The patient verbalized understanding. The patient was told to call to get lab results if they haven't heard anything in the next week.     Freeman Caldron, PA-C [facility] [place], [place]   10/08/2018, 10:07 AM

## 2018-10-08 NOTE — Progress Notes (Signed)
Patient stated she is here for UTI treatment follow-up.

## 2018-10-08 NOTE — Patient Instructions (Signed)
Check your blood sugars fasting and with meals. Drink a lot of water.

## 2018-10-09 LAB — URINE CULTURE

## 2018-10-09 LAB — URINE CYTOLOGY ANCILLARY ONLY
Chlamydia: NEGATIVE
Neisseria Gonorrhea: NEGATIVE
Trichomonas: NEGATIVE

## 2018-10-13 LAB — URINE CYTOLOGY ANCILLARY ONLY
Bacterial vaginitis: NEGATIVE
Candida vaginitis: NEGATIVE

## 2018-10-14 ENCOUNTER — Telehealth: Payer: Self-pay

## 2018-10-14 NOTE — Telephone Encounter (Signed)
CMA spoke to patient to inform on results.  Pt. Verified DOB. Pt. Understood.  Spanish interpreter Lucianne Muss [phone] assist with the call.

## 2018-10-14 NOTE — Telephone Encounter (Signed)
-----   Message from Anders Simmonds, [place] sent at 10/13/2018 12:17 PM EST ----- Please call patient.  Urine did not show any STD/infection or yeast.  follow as planned.  Thanks, Georgian Co, PA-C

## 2018-10-21 MED FILL — !LANTUS 100 UNITS/ML VIAL: 100 | 28 days supply | Qty: 10 | Fill #1

## 2018-10-21 MED FILL — TRUEplus LANCETS 28G MISC: 30 days supply | Qty: 100 | Fill #1

## 2018-11-22 ENCOUNTER — Encounter: Payer: Self-pay | Admitting: Family Medicine

## 2018-11-25 ENCOUNTER — Ambulatory Visit: Payer: Self-pay | Attending: Family Medicine | Admitting: Family Medicine

## 2018-11-25 ENCOUNTER — Encounter: Payer: Self-pay | Admitting: Family Medicine

## 2018-11-25 ENCOUNTER — Other Ambulatory Visit: Payer: Self-pay

## 2018-11-25 DIAGNOSIS — Z794 Long term (current) use of insulin: Secondary | ICD-10-CM

## 2018-11-25 DIAGNOSIS — E1142 Type 2 diabetes mellitus with diabetic polyneuropathy: Secondary | ICD-10-CM

## 2018-11-25 DIAGNOSIS — Z79899 Other long term (current) drug therapy: Secondary | ICD-10-CM

## 2018-11-25 DIAGNOSIS — E1169 Type 2 diabetes mellitus with other specified complication: Secondary | ICD-10-CM

## 2018-11-25 MED ORDER — GABAPENTIN 100 MG PO CAPS: 100.0000 mg | ORAL_CAPSULE | Freq: Every day | ORAL | 3 refills | Status: DC

## 2018-11-25 NOTE — Progress Notes (Signed)
Virtual Visit via Telephone Note  I connected with Margrit Minner on 11/25/18 at 4:01 pm EST by telephone and verified that I am speaking with the correct person using two identifiers with the use of telephone interpreter service   I discussed the limitations, risks, security and privacy concerns of performing an evaluation and management service by telephone and the availability of in person appointments. I also discussed with the patient that there may be a patient responsible charge related to this service. The patient expressed understanding and agreed to proceed.  Due to current restrictions/limitations of-office visits due to the COVID-19 pandemic, this scheduled clinical appointment was converted to a telehealth visit  History of Present Illness:      58 year old female with a history of diabetes which was diagnosed last fall and patient also with history of pyelonephritis.  Patient required hospitalization secondary to nonketotic hyperglycemic state and treatment of pyelonephritis on 09/18/2018.  Patient reports that she continues to take Lantus 25 units daily and her fasting blood sugars are now in the 80s to low 100s.  Patient denies any hypoglycemic episodes-no episodes of weakness, sweating or confusion.  Patient denies urinary frequency, increased thirst or blurred vision.  Patient has had recent onset of tingling/burning sensation in her feet.  Patient also with complaint of recent onset of hair loss.  Patient denies any excessive fatigue.  Patient has had anemia with Hgb of 11.7 on 09/18/2018 .Patient feels that her blood sugars are currently controlled.  Patient has no other complaints at today's visit. Hgb A1c was 14.9 on 09/18/2018 and 10.5 on 10/02/2018 with normal MCV.  CBC Latest Ref Rng & Units 10/02/2018 09/22/2018 09/21/2018  WBC 3.4 - 10.8 x10E3/uL 11.0(H) 13.1(H) 14.6(H)  Hemoglobin 11.1 - 15.9 g/dL 10.5(L) 11.3(L) 11.3(L)  Hematocrit 34.0 - 46.6 % 32.0(L) 36.1 34.9(L)   Platelets 150 - 450 x10E3/uL 422 349 346   BMP Latest Ref Rng & Units 10/02/2018 09/21/2018 09/19/2018  Glucose 65 - 99 mg/dL 255(H) 283(H) 386(H)  BUN 6 - 24 mg/dL 14 16 13   Creatinine 0.57 - 1.00 mg/dL 0.89 0.79 0.70  BUN/Creat Ratio 9 - 23 16 - -  Sodium 134 - 144 mmol/L 130(L) 132(L) 134(L)  Potassium 3.5 - 5.2 mmol/L 4.4 3.8 3.7  Chloride 96 - 106 mmol/L 89(L) 96(L) 99  CO2 20 - 29 mmol/L 23 25 26   Calcium 8.7 - 10.2 mg/dL 9.0 8.6(L) 8.3(L)   Past Medical History:  Diagnosis Date  . Diabetes mellitus without complication ([place])   . Pre-diabetes    Past Surgical History:  Procedure Laterality Date  . CESAREAN SECTION     Social History   Tobacco Use  . Smoking status: Never Smoker  . Smokeless tobacco: Never Used  Substance Use Topics  . Alcohol use: Never    Frequency: Never  . Drug use: Never   Allergies  Allergen Reactions  . Fish Allergy Shortness Of Breath, Itching and Swelling  . Shellfish-Derived Products Shortness Of Breath, Itching and Swelling     Observations/Objective: No vital signs or physical examination at today's visit as visit was conducted via telephone  Review of Systems  Constitutional: Negative for chills and fever.  HENT: Negative for congestion and sore throat.   Eyes: Negative for blurred vision and double vision.  Respiratory: Negative for cough and shortness of breath.   Cardiovascular: Negative for chest pain, palpitations and leg swelling.  Gastrointestinal: Negative for abdominal pain, constipation, diarrhea, nausea and vomiting.  Musculoskeletal: Negative  for myalgias.  Neurological: Positive for tingling (tingling and burning sensation in the feet). Negative for dizziness and headaches.     Assessment and Plan: 1. Type 2 diabetes mellitus with other specified complication, with long-term current use of insulin [facility])     Patient Active Problem List   Diagnosis Date Noted  . Type 2 diabetes mellitus with hyperglycemia, without  long-term current use of insulin ([place]) 09/18/2018  . Pyelonephritis 09/18/2018     Current Outpatient Medications on File Prior to Visit  Medication Sig Dispense Refill  . blood glucose meter kit and supplies KIT Dispense based on patient and insurance preference. Use up to four times daily as directed. (FOR ICD-9 250.00, 250.01). 1 each 0  . Blood Glucose Monitoring Suppl (TRUE METRIX AIR GLUCOSE METER) w/Device KIT 1 kit by Does not apply route 4 (four) times daily -  before meals and at bedtime. 1 kit 0  . ferrous sulfate 325 (65 FE) MG tablet Take 1 tablet (325 mg total) by mouth daily with breakfast. 90 tablet 1  . glipiZIDE (GLUCOTROL) 5 MG tablet Take 1 tablet (5 mg total) by mouth 2 (two) times daily before a meal. 60 tablet 0  . glucose blood (TRUE METRIX BLOOD GLUCOSE TEST) test strip Use as instructed to check morning blood sugar before eating, 2 hours after largest meal of the day and before bedtime 100 each 12  . insulin glargine (LANTUS) 100 UNIT/ML injection Inject 0.25 mLs (25 Units total) into the skin daily. 10 mL 4  . Insulin Syringes, Disposable, U-100 0.3 ML MISC Use daily for lantus injections 100 each 1  . Lancets (ACCU-CHEK MULTICLIX) lancets Use as directed 100 each 5  . metFORMIN (GLUCOPHAGE) 1000 MG tablet Take 1 tablet (1,000 mg total) by mouth 2 (two) times daily after a meal. 60 tablet 3  . olopatadine (PATANOL) 0.1 % ophthalmic solution Place 1 drop into the right eye 2 (two) times daily. 5 mL 12  . TRUEPLUS LANCETS 28G MISC Use to check blood sugar 3 times per day 100 each 11   No current facility-administered medications on file prior to visit.     Allergies  Allergen Reactions  . Fish Allergy Shortness Of Breath, Itching and Swelling  . Shellfish-Derived Products Shortness Of Breath, Itching and Swelling    Social History   Tobacco Use  . Smoking status: Never Smoker  . Smokeless tobacco: Never Used  Substance Use Topics  . Alcohol use: Never     Frequency: Never  . Drug use: Never     Family History  Problem Relation Age of Onset  . Hypertension Sister   . Hypertension Brother     Past Surgical History:  Procedure Laterality Date  . CESAREAN SECTION      ROS: Review of Systems  Constitutional: Negative for chills and fever.  HENT: Negative for congestion and sore throat.   Eyes: Negative for blurred vision and double vision.  Respiratory: Negative for cough and shortness of breath.   Cardiovascular: Negative for chest pain, palpitations and leg swelling.  Gastrointestinal: Negative for abdominal pain, constipation, diarrhea, nausea and vomiting.  Musculoskeletal: Negative for myalgias.  Neurological: Positive for tingling (tingling and burning sensation in the feet). Negative for dizziness and headaches.    Physical Exam -not done as visit was conducted via telephone     ASSESSMENT AND PLAN: 1. Type 2 diabetes mellitus with other specified complication, with long-term current use of insulin [facility]) Patient with previously elevated/uncontrolled blood  sugars and status post hospitalization for pyelonephritis along with hyperglycemia.  Patient now reports that her blood sugars are well controlled on her current regimen of Lantus 25 units once daily along with metformin and glipizide.  Patient will return to clinic for BMP.  Patient denies any hypoglycemic episodes but depending on blood work and if she does have episodes of low blood sugar, patient's Lantus will be decreased.  Patient does have complaint of recent hair loss and this could actually be related to her prior illness a few months ago.  Patient however has also had some anemia and therefore will check CBC and TSH. - Basic Metabolic Panel; Future - CBC with Differential; Future - TSH; Future  2. Diabetic peripheral neuropathy associated with type 2 diabetes mellitus ([place]) Patient with complaint of burning and tingling in her feet which is most likely related to  diabetic peripheral neuropathy associated with her diabetes.  Patient was asked to come into the clinic for assessment in approximately 2 months at which time it will hopefully be safe for patient to do so.  Patient will have diabetic foot exam to make sure that she does not have other issues such as fungal infection that could be contributing to burning sensation.  Patient will be provided with prescription for gabapentin 100 mg at bedtime to help with the burning sensation and patient was made aware that she can call the office to have dose of medication increased if she tolerates this well or if she wishes to take the medication several times per day to help with her symptoms. - gabapentin (NEURONTIN) 100 MG capsule; Take 1 capsule (100 mg total) by mouth at bedtime. To help with burning in feet  Dispense: 90 capsule; Refill: 3  3. Encounter for long-term (current) use of medications Patient will return for BMP and CBC in follow-up of hair loss as well as TSH in follow-up of hair loss as well as because she is diabetic and therefore at increased risk of developing hypothyroidism. - Basic Metabolic Panel; Future - CBC with Differential; Future  Allergies as of 11/25/2018      Reactions   Fish Allergy Shortness Of Breath, Itching, Swelling   Shellfish-derived Products Shortness Of Breath, Itching, Swelling      Medication List       Accurate as of November 25, 2018  5:10 PM. Always use your most recent med list.        blood glucose meter kit and supplies Kit Dispense based on patient and insurance preference. Use up to four times daily as directed. (FOR ICD-9 250.00, 250.01).   ferrous sulfate 325 (65 FE) MG tablet Take 1 tablet (325 mg total) by mouth daily with breakfast.   gabapentin 100 MG capsule Commonly known as:  NEURONTIN Take 1 capsule (100 mg total) by mouth at bedtime. To help with burning in feet   glipiZIDE 5 MG tablet Commonly known as:  GLUCOTROL Take 1 tablet (5 mg  total) by mouth 2 (two) times daily before a meal.   glucose blood test strip Commonly known as:  True Metrix Blood Glucose Test Use as instructed to check morning blood sugar before eating, 2 hours after largest meal of the day and before bedtime   insulin glargine 100 UNIT/ML injection Commonly known as:  LANTUS Inject 0.25 mLs (25 Units total) into the skin daily.   Insulin Syringes (Disposable) U-100 0.3 ML Misc Use daily for lantus injections   metFORMIN 1000 MG tablet Commonly known as:  GLUCOPHAGE Take 1 tablet (1,000 mg total) by mouth 2 (two) times daily after a meal.   olopatadine 0.1 % ophthalmic solution Commonly known as:  PATANOL Place 1 drop into the right eye 2 (two) times daily.   True Metrix Air Glucose Meter w/Device Kit 1 kit by Does not apply route 4 (four) times daily -  before meals and at bedtime.   TRUEplus Lancets 28G Misc Use to check blood sugar 3 times per day   accu-chek multiclix lancets Use as directed        Follow Up Instructions:    I discussed the assessment and treatment plan with the patient. The patient was provided an opportunity to ask questions and all were answered. The patient agreed with the plan and demonstrated an understanding of the instructions.   The patient was advised to call back or seek an in-person evaluation if the symptoms worsen or if the condition fails to improve as anticipated.  I provided 16 minutes of non-face-to-face time during this encounter.   Antony Blackbird, MD

## 2018-11-25 NOTE — Progress Notes (Signed)
Medical Assistant used [employer] to contact patient.  Interpreter Name: Interpreter #:  Patient verified DOB Patient has not taken medication today. Patient has eaten today. Patient denies any pain at this time. Patient checks her levels daily and the last reading was 101 after eating.

## 2018-11-26 MED FILL — GABAPENTIN 100 MG CAP: 100 | 30 days supply | Qty: 30 | Fill #0

## 2018-11-26 MED FILL — !LANTUS 100 UNITS/ML VIAL: 100 | 28 days supply | Qty: 10 | Fill #2

## 2018-11-27 MED FILL — TRUE METRIX TEST STRIP: 30 days supply | Qty: 100 | Fill #1

## 2019-01-07 MED FILL — TRUEPLUS SYR 0.3ML 31GX5/16: 31G X 5/16" | 25 days supply | Qty: 100 | Fill #1

## 2019-01-07 MED FILL — !LANTUS 100 UNITS/ML VIAL: 100 | 28 days supply | Qty: 10 | Fill #3

## 2019-01-07 MED FILL — TRUE METRIX TEST STRIP: 30 days supply | Qty: 100 | Fill #2

## 2019-01-07 MED FILL — TRUEplus LANCETS 28G MISC: 30 days supply | Qty: 100 | Fill #2

## 2019-01-23 ENCOUNTER — Encounter: Payer: Self-pay | Admitting: Family Medicine

## 2019-01-25 ENCOUNTER — Ambulatory Visit: Payer: Self-pay | Admitting: Family Medicine

## 2019-02-04 NOTE — Progress Notes (Signed)
Dm follow up, per pt her feet has been hurting her a lot, per pt its both feet and due to her work and when she's resting it still hurts. Per pt her feet are not swelling too much but you can see the marks of the socks.   Blood sugar today was 112 this morning at home  Med refill for Gabapentin and all other med

## 2019-02-05 ENCOUNTER — Ambulatory Visit: Payer: Self-pay | Attending: Family Medicine | Admitting: Family Medicine

## 2019-02-05 ENCOUNTER — Encounter: Payer: Self-pay | Admitting: Family Medicine

## 2019-02-05 ENCOUNTER — Other Ambulatory Visit: Payer: Self-pay

## 2019-02-05 DIAGNOSIS — Z789 Other specified health status: Secondary | ICD-10-CM

## 2019-02-05 DIAGNOSIS — Z794 Long term (current) use of insulin: Secondary | ICD-10-CM

## 2019-02-05 DIAGNOSIS — E1142 Type 2 diabetes mellitus with diabetic polyneuropathy: Secondary | ICD-10-CM

## 2019-02-05 DIAGNOSIS — Z603 Acculturation difficulty: Secondary | ICD-10-CM

## 2019-02-05 DIAGNOSIS — D649 Anemia, unspecified: Secondary | ICD-10-CM

## 2019-02-05 DIAGNOSIS — Z758 Other problems related to medical facilities and other health care: Secondary | ICD-10-CM

## 2019-02-05 MED ORDER — METFORMIN HCL 1000 MG PO TABS: 1000.0000 mg | ORAL_TABLET | Freq: Two times a day (BID) | ORAL | 3 refills | Status: DC

## 2019-02-05 MED ORDER — GABAPENTIN 100 MG PO CAPS: ORAL_CAPSULE | ORAL | 6 refills | Status: DC

## 2019-02-05 MED ORDER — GLIPIZIDE 5 MG PO TABS: 5.0000 mg | ORAL_TABLET | Freq: Two times a day (BID) | ORAL | 0 refills | Status: DC

## 2019-02-05 MED ORDER — FERROUS SULFATE 325 (65 FE) MG PO TABS: 325.0000 mg | ORAL_TABLET | Freq: Every day | ORAL | 1 refills | Status: AC

## 2019-02-05 MED FILL — metFORMIN HCL 1000 MG TABS: 1000 | 30 days supply | Qty: 60 | Fill #0

## 2019-02-05 MED FILL — ?GLIPIZIDE 5MG TABLET: 5 | 30 days supply | Qty: 60 | Fill #0

## 2019-02-05 MED FILL — FERROUS SULFATE 325 MG TAB: 325 (65 FE) | 30 days supply | Qty: 30 | Fill #0

## 2019-02-05 MED FILL — GABAPENTIN 100 MG CAP: 100 | 30 days supply | Qty: 120 | Fill #0

## 2019-02-05 NOTE — Progress Notes (Signed)
Virtual Visit via Telephone Note  I connected with@ on 02/05/19 at  2:50 PM EDT by telephone and verified that I am speaking with the correct person using two identifiers.   I discussed the limitations, risks, security and privacy concerns of performing an evaluation and management service by telephone and the availability of in person appointments. I also discussed with the patient that there may be a patient responsible charge related to this service. The patient expressed understanding and agreed to proceed.  Patient Location: Home Provider Location: Office Others participating in call: Call initiated by Guillermina [place] Richard, RMA who obtained Spanish interpreter [facility] due to a language barrier   History of Present Illness:      58 year old female with type 2 diabetes which now requires the use of insulin.  She reports that her blood sugars are now very well controlled and are generally 120 or less fasting.  She denies any issues with hypoglycemia, no blood sugars below 80 and no symptoms such as sweatiness or feeling shaky.  He does need refill of glipizide and metformin as she recently ran out of these medications.  Patient also reports that she needs refill of Neurontin as she continues to have burning/sharp pains in her feet not only at night but in the daytime as well.  Pain is about a 8 on a 0-to-10 scale.  Patient states that Neurontin did help with the pain and also helped her to fall asleep despite the pain.  Patient reports no other symptoms.  She does have a history of anemia but has run out of her iron medication.  She denies any unusual bruising or bleeding.  No blood in the stool, no blood in the urine and no dark stool/melena.  She has had no headaches or dizziness, no chest pain or palpitations, no shortness of breath or cough.  No urinary frequency or increased thirst.  No dysuria.  No increased muscle or joint pain other than the pain in her feet.  No  shortness of breath or cough.  No recent fever or chills.   Past Medical History:  Diagnosis Date  . Diabetes mellitus without complication (HCC)   . Pre-diabetes     Past Surgical History:  Procedure Laterality Date  . CESAREAN SECTION      Family History  Problem Relation Age of Onset  . Hypertension Sister   . Hypertension Brother     Social History   Tobacco Use  . Smoking status: Never Smoker  . Smokeless tobacco: Never Used  Substance Use Topics  . Alcohol use: Never    Frequency: Never  . Drug use: Never     Allergies  Allergen Reactions  . Fish Allergy Shortness Of Breath, Itching and Swelling  . Shellfish-Derived Products Shortness Of Breath, Itching and Swelling       Observations/Objective: No vital signs or physical exam conducted as visit was done via telephone  Assessment and Plan: 1. Diabetic peripheral neuropathy associated with type 2 diabetes mellitus (HCC); 2.  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  Patient reports good control of her blood sugars as she states that her fasting blood sugars are in the 120s or less.  She denies any symptoms related to her diabetes other than her peripheral neuropathy with neuropathic foot pain.  Patient was previously taking gabapentin 100 mg at night which did help however she continues to have pain in  the daytime as well.  Patient will have increase in her gabapentin to 1 pill in the morning, 1 in the afternoon and 2 at bedtime.  She is encouraged to initially try daytime doses when she does not have to work in case the medication causes drowsiness or dizziness.  Patient will continue to monitor her home blood sugars and call or return if she has any issues with hypoglycemia, elevated blood sugars or any other concerns.  Patient will return to clinic in 3 months and at that time will have hemoglobin A1c and BMP. - gabapentin  (NEURONTIN) 100 MG capsule; Take one pill in the morning, one in the afternoon and two at bedtime  Dispense: 120 capsule; Refill: 6  3. . Anemia, unspecified type Patient with normocytic anemia on CBC done in February of this year with hemoglobin of 10.5.  And normal MCV of 81.  Patient will have repeat CBC at her appointment in 3 months.  We will also discuss colon cancer screening.  Refill provided of ferrous sulfate to take once daily. - ferrous sulfate 325 (65 FE) MG tablet; Take 1 tablet (325 mg total) by mouth daily with breakfast.  Dispense: 90 tablet; Refill: 1  4.  Language barrier [facility] interpretation services used to help with language barrier as patient is Spanish-speaking.  All questions were answered to patient's satisfaction.  Follow Up Instructions:Return in about 3 months (around 05/08/2019) for DM follow-up and labs.    I discussed the assessment and treatment plan with the patient. The patient was provided an opportunity to ask questions and all were answered. The patient agreed with the plan and demonstrated an understanding of the instructions.   The patient was advised to call back or seek an in-person evaluation if the symptoms worsen or if the condition fails to improve as anticipated.  I provided 8 minutes of non-face-to-face time during this encounter.   Antony Blackbird, MD

## 2019-02-08 MED FILL — TRUEplus LANCETS 28G MISC: 30 days supply | Qty: 100 | Fill #3

## 2019-02-08 MED FILL — TRUE METRIX TEST STRIP: 30 days supply | Qty: 100 | Fill #3

## 2019-03-12 MED FILL — GABAPENTIN 100 MG CAP: 100 | 30 days supply | Qty: 30 | Fill #1

## 2019-03-12 MED FILL — metFORMIN HCL 1000 MG TABS: 1000 | 30 days supply | Qty: 60 | Fill #1

## 2019-03-12 MED FILL — TRUE METRIX TEST STRIP: 30 days supply | Qty: 100 | Fill #4

## 2019-03-12 MED FILL — TRUEplus LANCETS 28G MISC: 30 days supply | Qty: 100 | Fill #4

## 2019-03-12 MED FILL — !LANTUS 100 UNITS/ML VIAL: 100 | 28 days supply | Qty: 10 | Fill #4

## 2019-04-09 MED FILL — GABAPENTIN 100 MG CAP: 100 | 30 days supply | Qty: 120 | Fill #1

## 2019-04-09 MED FILL — !LANTUS 100 UNITS/ML VIAL: 100 | 28 days supply | Qty: 10 | Fill #5

## 2019-05-14 MED FILL — TRUE METRIX TEST STRIP: 30 days supply | Qty: 100 | Fill #5

## 2019-05-14 MED FILL — GABAPENTIN 100 MG CAP: 100 | 30 days supply | Qty: 120 | Fill #2

## 2019-05-14 MED FILL — !LANTUS 100 UNITS/ML VIAL: 100 | 28 days supply | Qty: 10 | Fill #6

## 2019-05-14 MED FILL — TRUEplus LANCETS 28G MISC: 30 days supply | Qty: 100 | Fill #5

## 2019-06-14 ENCOUNTER — Ambulatory Visit: Payer: Self-pay | Admitting: Family Medicine

## 2019-06-18 MED FILL — TRUE METRIX TEST STRIP: 30 days supply | Qty: 100 | Fill #6

## 2019-06-18 MED FILL — TRUEplus LANCETS 28G MISC: 30 days supply | Qty: 100 | Fill #6

## 2019-06-18 MED FILL — !LANTUS 100 UNITS/ML VIAL: 100 | 28 days supply | Qty: 10 | Fill #7

## 2019-06-18 MED FILL — GABAPENTIN 100 MG CAP: 100 | 30 days supply | Qty: 120 | Fill #3

## 2019-07-08 ENCOUNTER — Ambulatory Visit: Payer: Self-pay | Admitting: Pharmacist

## 2019-07-08 ENCOUNTER — Ambulatory Visit: Payer: Self-pay | Attending: Family Medicine | Admitting: Family Medicine

## 2019-07-08 ENCOUNTER — Other Ambulatory Visit: Payer: Self-pay

## 2019-07-08 ENCOUNTER — Encounter: Payer: Self-pay | Admitting: Family Medicine

## 2019-07-08 DIAGNOSIS — Z79899 Other long term (current) drug therapy: Secondary | ICD-10-CM

## 2019-07-08 DIAGNOSIS — D649 Anemia, unspecified: Secondary | ICD-10-CM

## 2019-07-08 DIAGNOSIS — E1142 Type 2 diabetes mellitus with diabetic polyneuropathy: Secondary | ICD-10-CM

## 2019-07-08 DIAGNOSIS — Z794 Long term (current) use of insulin: Secondary | ICD-10-CM

## 2019-07-08 MED ORDER — GABAPENTIN 100 MG PO CAPS: ORAL_CAPSULE | ORAL | 6 refills | Status: DC

## 2019-07-08 MED ORDER — TRUE METRIX BLOOD GLUCOSE TEST VI STRP: ORAL_STRIP | 12 refills | Status: DC

## 2019-07-08 MED ORDER — INSULIN GLARGINE 100 UNIT/ML ~~LOC~~ SOLN: 25.0000 [IU] | Freq: Every day | SUBCUTANEOUS | 4 refills | Status: DC

## 2019-07-08 MED FILL — !LANTUS 100 UNITS/ML VIAL: 100 | 28 days supply | Qty: 10 | Fill #0

## 2019-07-08 NOTE — Progress Notes (Signed)
Virtual Visit via Telephone Note  I connected with Nancy Stephenson on 07/08/19 at  4:10 PM EST by telephone and verified that I am speaking with the correct person using two identifiers.   I discussed the limitations, risks, security and privacy concerns of performing an evaluation and management service by telephone and the availability of in person appointments. I also discussed with the patient that there may be a patient responsible charge related to this service. The patient expressed understanding and agreed to proceed.  Patient Location: Home Provider Location: [facility] Others participating in call: Call initiated by [place], CMA who then obtained Spanish-speaking interpreter through [facility] interpretation systems   History of Present Illness:          58 year old female seen in follow-up of type 2 diabetes.  Patient with diagnosis of type 2 diabetes in January 2020 and hemoglobin A1c at that time was 14.9.  Patient states that now her blood sugars are very well controlled with fasting blood sugars being in the high 80s to 110s.  She denies any increased thirst, no blurred vision, and no urinary frequency.  Gabapentin helps with any numbness or tingling in her hands or feet.  She denies any side effects from the medications.  She feels well at this time.  She has had no further issues with urinary tract infections.  No current dysuria, no abdominal pain and no back pain.  She does have history of mild anemia with hemoglobin of 11.7 on 09/18/2018 but reports no current issues with fatigue, no blood in the stool or black stools.  Overall she feels that she is doing well.  Past Medical History:  Diagnosis Date  . Diabetes mellitus without complication (HCC)   . Pre-diabetes     Past Surgical History:  Procedure Laterality Date  . CESAREAN SECTION      Family History  Problem Relation Age of Onset  . Hypertension Sister   . Hypertension Brother     Social History    Tobacco Use  . Smoking status: Never Smoker  . Smokeless tobacco: Never Used  Substance Use Topics  . Alcohol use: Never    Frequency: Never  . Drug use: Never     Allergies  Allergen Reactions  . Fish Allergy Shortness Of Breath, Itching and Swelling  . Shellfish-Derived Products Shortness Of Breath, Itching and Swelling       Observations/Objective: No vital signs or physical exam conducted as visit was done via telephone  Assessment and Plan: 1. Type 2 diabetes mellitus with diabetic polyneuropathy, with long-term current use of insulin (HCC) Patient reports that symptoms associated with diabetes have resolved such as increased thirst,or urinary frequency.  Gabapentin helps with sensation of numbness/tingling in her hands and feet.  She reports that her blood sugars are now very well controlled on her current medication regimen which she will continue.  She has been asked to schedule lab visit for comprehensive metabolic panel and hemoglobin H6D in follow-up.  She will also need future lab visit for lipid panel and at next visit we will again discuss recommendation regarding statin therapy to help reduce the risk of cardiovascular disease associated with being diabetic and will have patient scheduled for diabetic eye exam if this has not been done in the past 12 months. - Comprehensive metabolic panel; Future - Hemoglobin A1c; Future  2. Anemia, unspecified type Patient with history of anemia and will have CBC at upcoming lab visit to see if any further  evaluation may be needed based on the results. - CBC; Future  3. Encounter for long-term (current) use of medications Patient will return for lab visit in follow-up of her use of medications for the treatment of diabetes and peripheral neuropathy.   - Comprehensive metabolic panel; Future  4.  Language barrier [facility] interpretation system used at today's visit to help of language barrier  Follow Up Instructions:Return in about  4 months (around 11/05/2019) for Chronic issues-labs in 1 to 2 weeks and sooner appointment if labs abnormal.    I discussed the assessment and treatment plan with the patient. The patient was provided an opportunity to ask questions and all were answered. The patient agreed with the plan and demonstrated an understanding of the instructions.   The patient was advised to call back or seek an in-person evaluation if the symptoms worsen or if the condition fails to improve as anticipated.  I provided 8 minutes of non-face-to-face time during this encounter.   Antony Blackbird, MD

## 2019-07-08 NOTE — Progress Notes (Signed)
Patient verified DOB Patient has eaten today. Patient takes insulin at night. Patient denies pain at this time. CBG today was 101 before eating.

## 2019-07-09 ENCOUNTER — Ambulatory Visit (HOSPITAL_BASED_OUTPATIENT_CLINIC_OR_DEPARTMENT_OTHER): Payer: Self-pay | Admitting: Pharmacist

## 2019-07-09 ENCOUNTER — Other Ambulatory Visit: Payer: Self-pay

## 2019-07-09 ENCOUNTER — Ambulatory Visit: Payer: Self-pay | Attending: Family Medicine

## 2019-07-09 DIAGNOSIS — Z794 Long term (current) use of insulin: Secondary | ICD-10-CM

## 2019-07-09 DIAGNOSIS — E1169 Type 2 diabetes mellitus with other specified complication: Secondary | ICD-10-CM

## 2019-07-09 DIAGNOSIS — Z23 Encounter for immunization: Secondary | ICD-10-CM

## 2019-07-09 DIAGNOSIS — Z79899 Other long term (current) drug therapy: Secondary | ICD-10-CM

## 2019-07-09 NOTE — Progress Notes (Signed)
Patient presents for vaccination against influenza per orders of Dr. Fulp. Consent given. Counseling provided. No contraindications exists. Vaccine administered without incident.   

## 2019-07-10 LAB — CBC WITH DIFFERENTIAL/PLATELET
Basophils Absolute: 0.1 x10E3/uL (ref 0.0–0.2)
Basos: 1 %
EOS (ABSOLUTE): 0.3 x10E3/uL (ref 0.0–0.4)
Eos: 2 %
Hematocrit: 41.3 % (ref 34.0–46.6)
Hemoglobin: 13.6 g/dL (ref 11.1–15.9)
Immature Grans (Abs): 0 x10E3/uL (ref 0.0–0.1)
Immature Granulocytes: 0 %
Lymphocytes Absolute: 3.2 x10E3/uL — ABNORMAL HIGH (ref 0.7–3.1)
Lymphs: 28 %
MCH: 27.4 pg (ref 26.6–33.0)
MCHC: 32.9 g/dL (ref 31.5–35.7)
MCV: 83 fL (ref 79–97)
Monocytes Absolute: 0.9 x10E3/uL (ref 0.1–0.9)
Monocytes: 7 %
Neutrophils Absolute: 7.2 x10E3/uL — ABNORMAL HIGH (ref 1.4–7.0)
Neutrophils: 62 %
Platelets: 252 x10E3/uL (ref 150–450)
RBC: 4.97 x10E6/uL (ref 3.77–5.28)
RDW: 12.4 % (ref 11.7–15.4)
WBC: 11.6 x10E3/uL — ABNORMAL HIGH (ref 3.4–10.8)

## 2019-07-10 LAB — TSH: TSH: 1.1 u[IU]/mL (ref 0.450–4.500)

## 2019-07-10 LAB — BASIC METABOLIC PANEL WITH GFR
BUN/Creatinine Ratio: 25 — ABNORMAL HIGH (ref 9–23)
BUN: 24 mg/dL (ref 6–24)
CO2: 27 mmol/L (ref 20–29)
Calcium: 9.6 mg/dL (ref 8.7–10.2)
Chloride: 101 mmol/L (ref 96–106)
Creatinine, Ser: 0.97 mg/dL (ref 0.57–1.00)
GFR calc Af Amer: 74 mL/min/1.73
GFR calc non Af Amer: 65 mL/min/1.73
Glucose: 106 mg/dL — ABNORMAL HIGH (ref 65–99)
Potassium: 4.2 mmol/L (ref 3.5–5.2)
Sodium: 142 mmol/L (ref 134–144)

## 2019-07-16 ENCOUNTER — Ambulatory Visit: Payer: Self-pay | Attending: Family Medicine

## 2019-07-16 ENCOUNTER — Other Ambulatory Visit: Payer: Self-pay

## 2019-07-16 DIAGNOSIS — E1142 Type 2 diabetes mellitus with diabetic polyneuropathy: Secondary | ICD-10-CM

## 2019-07-16 DIAGNOSIS — Z79899 Other long term (current) drug therapy: Secondary | ICD-10-CM

## 2019-07-16 DIAGNOSIS — D649 Anemia, unspecified: Secondary | ICD-10-CM

## 2019-07-16 MED FILL — GABAPENTIN 100 MG CAP: 100 | 30 days supply | Qty: 120 | Fill #4

## 2019-07-16 MED FILL — TRUE METRIX TEST STRIP: 25 days supply | Qty: 100 | Fill #1

## 2019-07-17 LAB — CBC
Hematocrit: 39.3 % (ref 34.0–46.6)
Hemoglobin: 13 g/dL (ref 11.1–15.9)
MCH: 28.3 pg (ref 26.6–33.0)
MCHC: 33.1 g/dL (ref 31.5–35.7)
MCV: 85 fL (ref 79–97)
Platelets: 213 x10E3/uL (ref 150–450)
RBC: 4.6 x10E6/uL (ref 3.77–5.28)
RDW: 12.5 % (ref 11.7–15.4)
WBC: 9.7 x10E3/uL (ref 3.4–10.8)

## 2019-07-17 LAB — COMPREHENSIVE METABOLIC PANEL WITH GFR
ALT: 16 IU/L (ref 0–32)
AST: 19 IU/L (ref 0–40)
Albumin/Globulin Ratio: 1.8 (ref 1.2–2.2)
Albumin: 4.2 g/dL (ref 3.8–4.9)
Alkaline Phosphatase: 114 IU/L (ref 39–117)
BUN/Creatinine Ratio: 22 (ref 9–23)
BUN: 24 mg/dL (ref 6–24)
Bilirubin Total: 0.3 mg/dL (ref 0.0–1.2)
CO2: 25 mmol/L (ref 20–29)
Calcium: 8.8 mg/dL (ref 8.7–10.2)
Chloride: 102 mmol/L (ref 96–106)
Creatinine, Ser: 1.07 mg/dL — ABNORMAL HIGH (ref 0.57–1.00)
GFR calc Af Amer: 66 mL/min/1.73
GFR calc non Af Amer: 57 mL/min/1.73 — ABNORMAL LOW
Globulin, Total: 2.4 g/dL (ref 1.5–4.5)
Glucose: 191 mg/dL — ABNORMAL HIGH (ref 65–99)
Potassium: 3.8 mmol/L (ref 3.5–5.2)
Sodium: 142 mmol/L (ref 134–144)
Total Protein: 6.6 g/dL (ref 6.0–8.5)

## 2019-07-17 LAB — HEMOGLOBIN A1C
Est. average glucose Bld gHb Est-mCnc: 131 mg/dL
Hgb A1c MFr Bld: 6.2 % — ABNORMAL HIGH (ref 4.8–5.6)

## 2019-08-08 IMAGING — CT CT ABD-PELV W/ CM
2 of 5 series · 15 of 46 positions shown, 17 images · IV contrast (omnipaque)
Comparison: None.

CLINICAL DATA: Unspecified abdominal pain, question pyelonephritis

EXAM:
CT ABDOMEN AND PELVIS WITH CONTRAST
TECHNIQUE: Multidetector CT imaging of the abdomen and pelvis was performed
using the standard protocol following bolus administration of
intravenous contrast.
CONTRAST:  100mL OMNIPAQUE IOHEXOL 300 MG/ML  SOLN

[Series 3: a/p w/ 5mm · axial · 0.85mm/px · z∈[+589,+1029]mm · 12 of 98 slices shown, 14 images]
[im 5/98  soft-tissue]
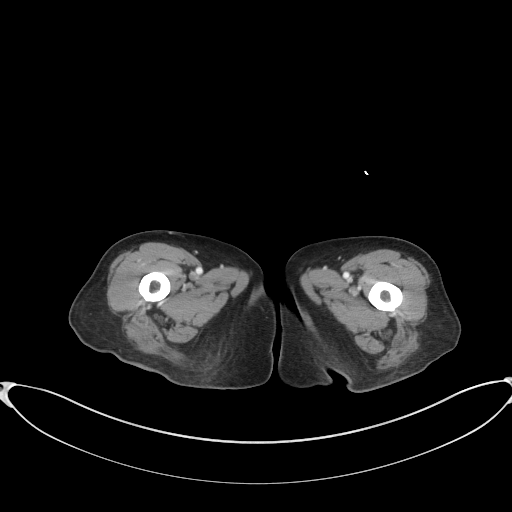
[im 5/98  bone]
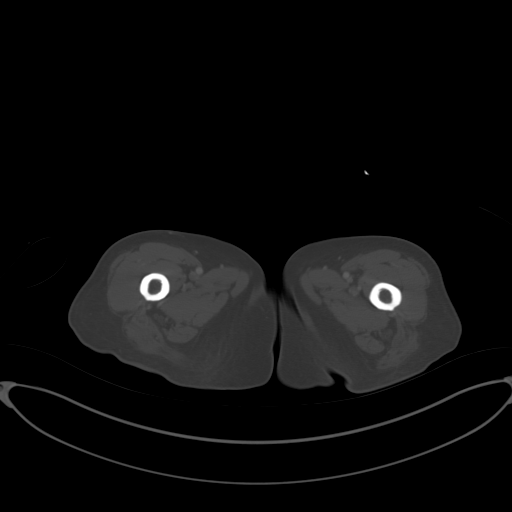
[im 15/98  soft-tissue]
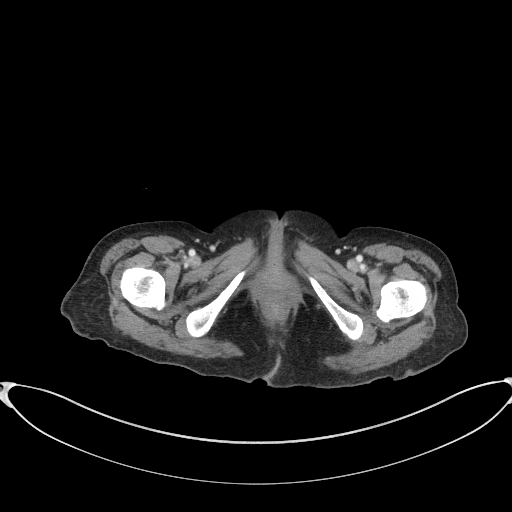
[im 20/98  soft-tissue]
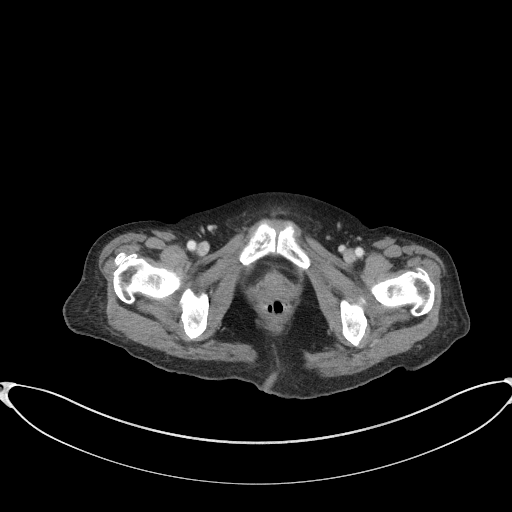
[im 30/98  soft-tissue]
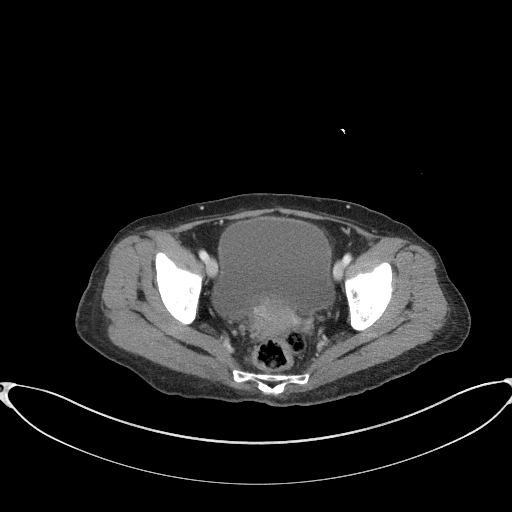
[im 39/98  soft-tissue]
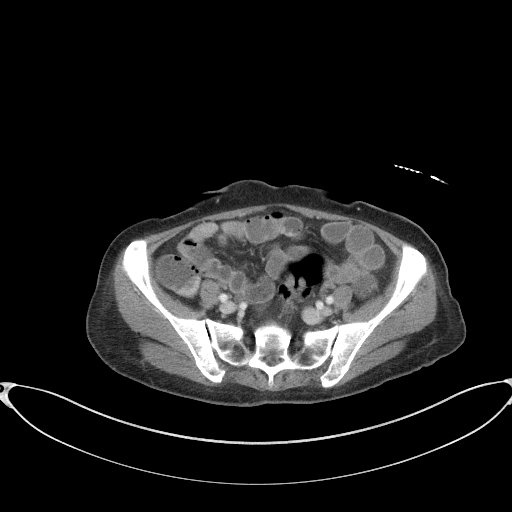
[im 44/98  soft-tissue]
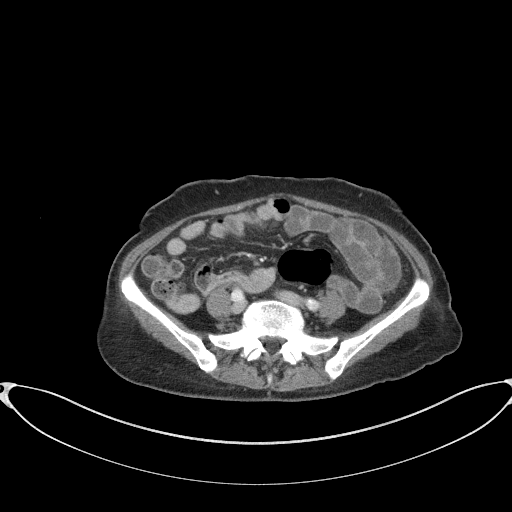
[im 54/98  soft-tissue]
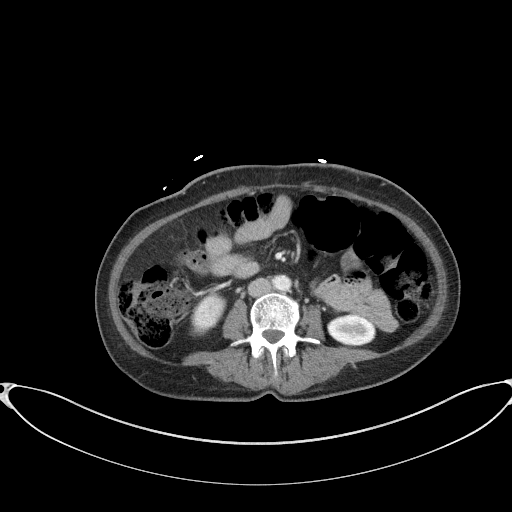
[im 59/98  soft-tissue]
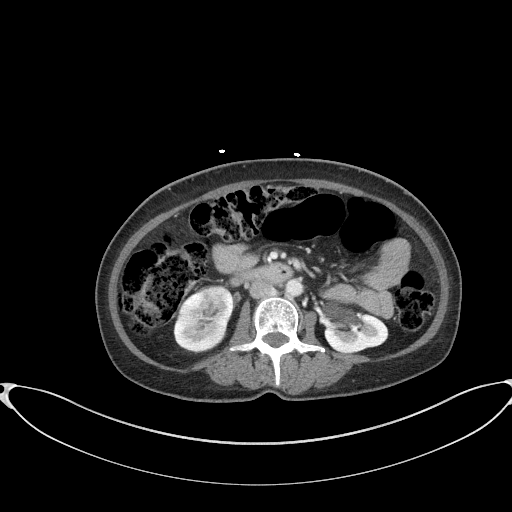
[im 68/98  soft-tissue]
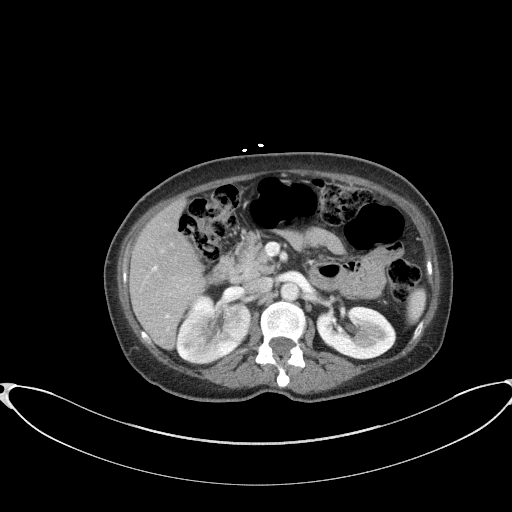
[im 68/98  bone]
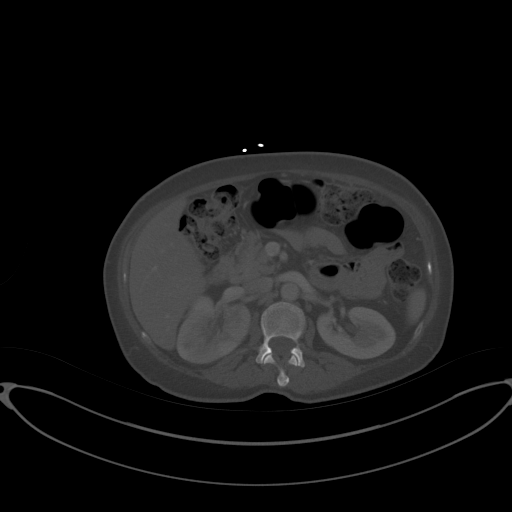
[im 78/98  soft-tissue]
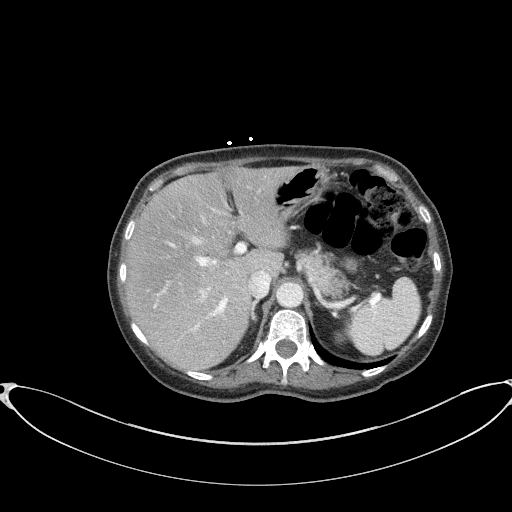
[im 83/98  soft-tissue]
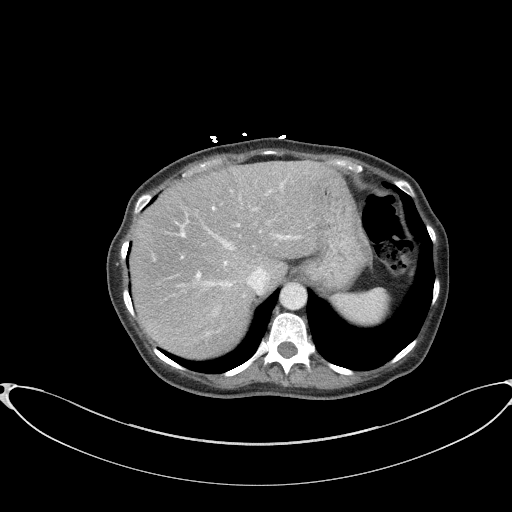
[im 93/98  soft-tissue]
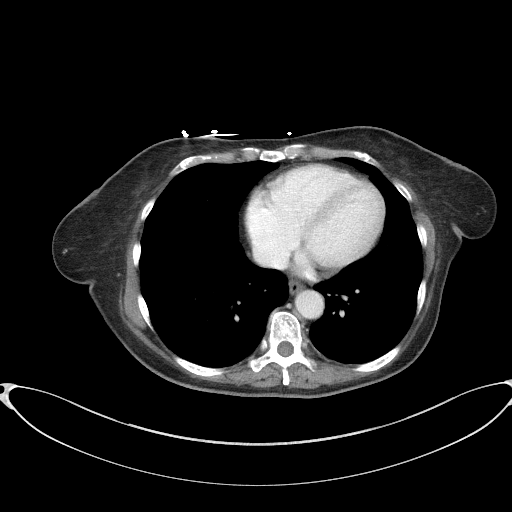

[Series 6: a/p w/ cor · coronal · 0.67mm/px · 3 of 151 slices shown]
[im 51/151  soft-tissue]
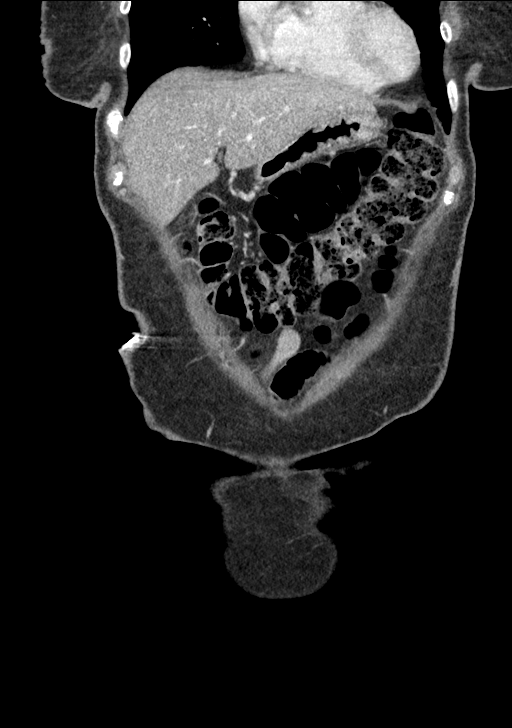
[im 67/151  soft-tissue]
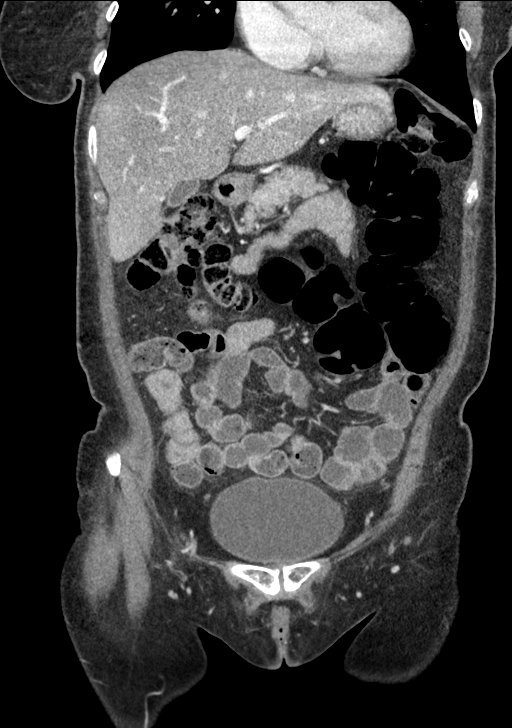
[im 84/151  soft-tissue]
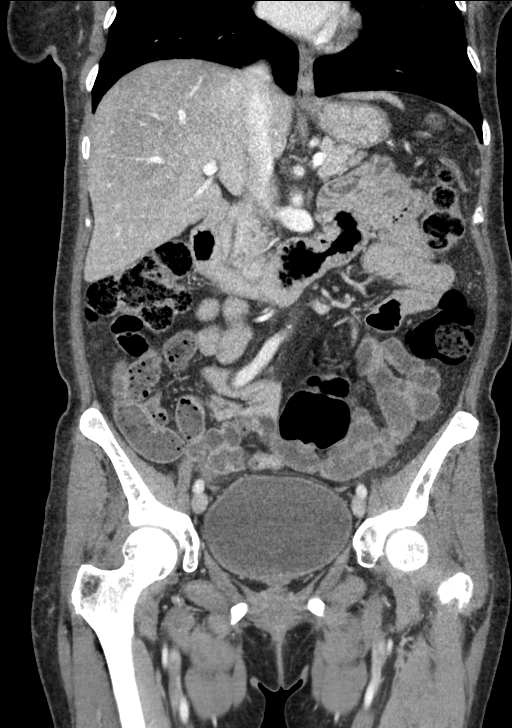

[15 of 46 positions shown; findings below may reference images not displayed]

FINDINGS: Lower chest: Top-normal heart size. Clear lung bases.

Hepatobiliary: Steatosis of the liver without space-occupying mass.
Mild fatty infiltration adjacent to the falciform. Nondistended
gallbladder free of stones. No biliary dilatation.

Pancreas: Normal

Spleen: Normal

Adrenals/Urinary Tract: Abnormal enhancement of the right kidney
with in homogeneous slightly striated appearance compatible with
right-sided pyelonephritis. Extrarenal pelvis is noted on left with
normal enhancement of the left kidney. Punctate upper pole renal
calculus is seen on the left. No hydroureteronephrosis. Urothelial
enhancement the right ureter compatible with urinary tract
infection. The urinary bladder is unremarkable.

Stomach/Bowel: Decompressed stomach. Normal small bowel rotation.
Mild fluid-filled distention without mechanical obstruction
involving distal jejunum and ileum. Findings may represent mild
small bowel enteritis, dysmotility or ileus among some
considerations. Moderate stool retention is seen within the right
colon. Normal appendix is identified.

Vascular/Lymphatic: Aortic atherosclerosis. No enlarged abdominal or
pelvic lymph nodes.

Reproductive: Uterus and bilateral adnexa are unremarkable.

Other: No abdominal wall hernia or abnormality. No abdominopelvic
ascites.

Musculoskeletal: Degenerative disc disease L5-S1. No acute nor
suspicious osseous abnormalities.
IMPRESSION: 1. Findings in keeping with right-sided pyelonephritis. Urothelial
enhancement of the right ureter is noted compatible with urinary
tract infection as well. There are more confluent areas of
hypodensity involving the right kidney in both upper and lower pole
as well as interpolar aspect. A developing lobar nephronia is not
excluded.
2. Mild fluid-filled distention of small bowel without mechanical
obstruction. Findings may represent a mild small bowel enteritis,
dysmotility or ileus among some considerations.
3. Hepatic steatosis.
4. Degenerative disc disease L5-S1.

Aortic Atherosclerosis ([id number]).

## 2019-08-24 MED FILL — GABAPENTIN 100 MG CAP: 100 | 30 days supply | Qty: 120 | Fill #5

## 2019-08-24 MED FILL — !LANTUS 100 UNITS/ML VIAL: 100 | 28 days supply | Qty: 10 | Fill #1

## 2019-08-24 MED FILL — TRUEplus LANCETS 28G MISC: 30 days supply | Qty: 100 | Fill #7

## 2019-08-24 MED FILL — TRUE METRIX TEST STRIP: 25 days supply | Qty: 100 | Fill #2

## 2019-09-27 ENCOUNTER — Other Ambulatory Visit: Payer: Self-pay | Admitting: Family Medicine

## 2019-09-27 DIAGNOSIS — E1165 Type 2 diabetes mellitus with hyperglycemia: Secondary | ICD-10-CM

## 2019-09-27 MED FILL — GABAPENTIN 100 MG CAPSULE: 100 | 30 days supply | Qty: 120 | Fill #0

## 2019-09-27 MED FILL — !LANTUS 100 UNITS/ML VIAL: 100 | 28 days supply | Qty: 10 | Fill #2

## 2019-09-29 MED FILL — TRUEplus LANCETS 28G MISC: 33 days supply | Qty: 100 | Fill #0

## 2019-09-30 MED FILL — TRUE METRIX TEST STRIP: 30 days supply | Qty: 100 | Fill #0

## 2019-10-11 ENCOUNTER — Encounter: Payer: Self-pay | Admitting: Family Medicine

## 2019-10-11 ENCOUNTER — Other Ambulatory Visit: Payer: Self-pay | Admitting: Family Medicine

## 2019-10-11 ENCOUNTER — Ambulatory Visit (HOSPITAL_BASED_OUTPATIENT_CLINIC_OR_DEPARTMENT_OTHER): Payer: Self-pay | Admitting: Family Medicine

## 2019-10-11 DIAGNOSIS — E1142 Type 2 diabetes mellitus with diabetic polyneuropathy: Secondary | ICD-10-CM

## 2019-10-11 DIAGNOSIS — Z794 Long term (current) use of insulin: Secondary | ICD-10-CM

## 2019-10-11 DIAGNOSIS — Z789 Other specified health status: Secondary | ICD-10-CM

## 2019-10-11 MED ORDER — INSULIN GLARGINE 100 UNIT/ML ~~LOC~~ SOLN: 25.0000 [IU] | Freq: Every day | SUBCUTANEOUS | 4 refills | Status: DC

## 2019-10-11 MED ORDER — TRUEPLUS LANCETS 28G MISC: 11 refills | Status: DC

## 2019-10-11 MED ORDER — GABAPENTIN 100 MG PO CAPS: ORAL_CAPSULE | ORAL | 6 refills | Status: DC

## 2019-10-11 MED ORDER — ACCU-CHEK MULTICLIX LANCETS MISC: 5 refills | Status: AC

## 2019-10-11 MED ORDER — INSULIN SYRINGES (DISPOSABLE) U-100 0.3 ML MISC: 3 refills | Status: AC

## 2019-10-11 MED ORDER — TRUE METRIX BLOOD GLUCOSE TEST VI STRP: ORAL_STRIP | 12 refills | Status: DC

## 2019-10-11 NOTE — Progress Notes (Signed)
Virtual Visit via Telephone Note  I connected with Nancy Stephenson on 10/11/19 at  2:50 PM EST by telephone and verified that I am speaking with the correct person using two identifiers.   I discussed the limitations, risks, security and privacy concerns of performing an evaluation and management service by telephone and the availability of in person appointments. I also discussed with the patient that there may be a patient responsible charge related to this service. The patient expressed understanding and agreed to proceed.  Patient Location: Home Provider Location: [facility] Others participating in call: Spanish speaking interpreter obtained   History of Present Illness:         59 yo female seen in follow-up of diabetes with peripheral neuropathy.  She was last seen in the office on 07/08/2019.  She reports that she needs refills of all medications for the treatment of her diabetes as well as new prescription for gabapentin to help with the painful numbness in her feet.  She will need new refill of diabetic testing supplies.  She feels that her blood sugars continue to remain well controlled.  She denies any increased thirst or frequent urination at this time.  Patient's last hemoglobin A1c on 07/16/2019 was 6.2.  Patient denies any significant hypoglycemic episodes.  She has had no chest pain or palpitations, no shortness of breath or cough, no fever or chills and no abdominal pain-no nausea or vomiting.  Past Medical History:  Diagnosis Date  . Diabetes mellitus without complication (HCC)   . Pre-diabetes     Past Surgical History:  Procedure Laterality Date  . CESAREAN SECTION      Family History  Problem Relation Age of Onset  . Hypertension Sister   . Hypertension Brother     Social History   Tobacco Use  . Smoking status: Never Smoker  . Smokeless tobacco: Never Used  Substance Use Topics  . Alcohol use: Never  . Drug use: Never     Allergies  Allergen  Reactions  . Fish Allergy Shortness Of Breath, Itching and Swelling  . Shellfish-Derived Products Shortness Of Breath, Itching and Swelling       Observations/Objective: No vital signs or physical exam conducted as visit was done via telephone  Assessment and Plan: 1. Type 2 diabetes mellitus with diabetic polyneuropathy, with long-term current use of insulin (HCC) Patient is provided with refills of current medications for the treatment of diabetes including Lantus along with new prescription for gabapentin to help with peripheral neuropathy.  She was made aware that the medication may cause dizziness and she should initially start with 1 or 2 pills at bedtime and then advance to daytime use after a week if tolerated.  Refills also provided of diabetic testing supplies and she is encouraged to monitor her blood sugars, follow a healthy, low carbohydrate diet and exercise as tolerated.  She has been asked to schedule lab appointment in 1 to 2 weeks for comprehensive metabolic panel and hemoglobin P5W. - gabapentin (NEURONTIN) 100 MG capsule; Take one pill in the morning, one in the afternoon and two at bedtime  Dispense: 120 capsule; Refill: 6 - Lancets (ACCU-CHEK MULTICLIX) lancets; Use as directed  Dispense: 100 each; Refill: 5 - TRUEplus Lancets 28G MISC; Use to check blood sugars  Dispense: 100 each; Refill: 11 - insulin glargine (LANTUS) 100 UNIT/ML injection; Inject 0.25 mLs (25 Units total) into the skin daily.  Dispense: 10 mL; Refill: 4 - Insulin Syringes, Disposable, U-100 0.3 ML  MISC; Use daily for lantus injections  Dispense: 100 each; Refill: 3 - glucose blood (TRUE METRIX BLOOD GLUCOSE TEST) test strip; Use as instructed to check morning blood sugar before eating, 2 hours after largest meal of the day and before bedtime  Dispense: 100 each; Refill: 12 - Comprehensive metabolic panel; Future - Hemoglobin A1c; Future  2.  Language barrier [facility] interpretation services used to help  with language barrier at today's visit   Follow Up Instructions:Return in about 4 months (around 02/08/2020) for Diabetes-3 to 25-month follow-up and blood work in 1 to 2 weeks.    I discussed the assessment and treatment plan with the patient. The patient was provided an opportunity to ask questions and all were answered. The patient agreed with the plan and demonstrated an understanding of the instructions.   The patient was advised to call back or seek an in-person evaluation if the symptoms worsen or if the condition fails to improve as anticipated.  I provided 12 minutes of non-face-to-face time during this encounter.   Antony Blackbird, MD

## 2019-10-11 NOTE — Progress Notes (Signed)
Patient verified DOB Interpreter Name: Dennie Maizes Interpreter #: [id number] Patient has eaten today. Patient has taken medication today. Patient denies pain at this time. CBG this morning before breakfast was 97.

## 2019-10-12 MED FILL — TRUEplus LANCETS 28G MISC: 25 days supply | Qty: 100 | Fill #0

## 2019-10-12 MED FILL — TRUE METRIX TEST STRIP: 25 days supply | Qty: 100 | Fill #0

## 2019-10-18 ENCOUNTER — Other Ambulatory Visit: Payer: Self-pay

## 2019-10-18 ENCOUNTER — Ambulatory Visit: Payer: Self-pay | Attending: Family Medicine

## 2019-10-18 DIAGNOSIS — Z794 Long term (current) use of insulin: Secondary | ICD-10-CM

## 2019-10-18 DIAGNOSIS — E1142 Type 2 diabetes mellitus with diabetic polyneuropathy: Secondary | ICD-10-CM

## 2019-10-19 LAB — COMPREHENSIVE METABOLIC PANEL WITH GFR
ALT: 16 IU/L (ref 0–32)
AST: 15 IU/L (ref 0–40)
Albumin/Globulin Ratio: 1.7 (ref 1.2–2.2)
Albumin: 4.3 g/dL (ref 3.8–4.9)
Alkaline Phosphatase: 131 IU/L — ABNORMAL HIGH (ref 39–117)
BUN/Creatinine Ratio: 24 — ABNORMAL HIGH (ref 9–23)
BUN: 28 mg/dL — ABNORMAL HIGH (ref 6–24)
Bilirubin Total: <0.2 mg/dL (ref 0.0–1.2)
CO2: 24 mmol/L (ref 20–29)
Calcium: 9.1 mg/dL (ref 8.7–10.2)
Chloride: 99 mmol/L (ref 96–106)
Creatinine, Ser: 1.17 mg/dL — ABNORMAL HIGH (ref 0.57–1.00)
GFR calc Af Amer: 59 mL/min/1.73 — ABNORMAL LOW
GFR calc non Af Amer: 51 mL/min/1.73 — ABNORMAL LOW
Globulin, Total: 2.5 g/dL (ref 1.5–4.5)
Glucose: 207 mg/dL — ABNORMAL HIGH (ref 65–99)
Potassium: 4.6 mmol/L (ref 3.5–5.2)
Sodium: 138 mmol/L (ref 134–144)
Total Protein: 6.8 g/dL (ref 6.0–8.5)

## 2019-10-19 LAB — HEMOGLOBIN A1C
Est. average glucose Bld gHb Est-mCnc: 160 mg/dL
Hgb A1c MFr Bld: 7.2 % — ABNORMAL HIGH (ref 4.8–5.6)

## 2019-10-21 ENCOUNTER — Telehealth: Payer: Self-pay | Admitting: Family Medicine

## 2019-10-21 NOTE — Telephone Encounter (Signed)
Patient was called with a [place] interpreter [place] [phone]. Interpreter stated that the phone goes straight to voicemail, voicemail was not set up, she was unable to leave a voicemail.

## 2019-11-01 MED FILL — TRUEplus LANCETS 28G MISC: 25 days supply | Qty: 100 | Fill #0

## 2019-11-01 MED FILL — GABAPENTIN 100 MG CAPSULE: 100 | 30 days supply | Qty: 120 | Fill #0

## 2019-11-01 MED FILL — $LANTUS 100 UNITS/ML VIAL: 100 | 28 days supply | Qty: 10 | Fill #0

## 2019-11-01 MED FILL — TRUE METRIX TEST STRIP: 25 days supply | Qty: 100 | Fill #0

## 2019-11-01 MED FILL — TRUEPLUS SYR 0.5ML 30GX5/16: 30G X 5/16" | 100 days supply | Qty: 100 | Fill #0

## 2019-12-21 MED FILL — $LANTUS 100 UNITS/ML VIAL: 100 | 28 days supply | Qty: 10 | Fill #1

## 2019-12-21 MED FILL — GABAPENTIN 100 MG CAPSULE: 100 | 30 days supply | Qty: 120 | Fill #1

## 2019-12-21 MED FILL — TRUE METRIX TEST STRIP: 25 days supply | Qty: 100 | Fill #1

## 2019-12-21 MED FILL — TRUEplus LANCETS 28G MISC: 25 days supply | Qty: 100 | Fill #1

## 2020-01-19 ENCOUNTER — Ambulatory Visit: Payer: Self-pay | Admitting: Family Medicine

## 2020-02-24 ENCOUNTER — Ambulatory Visit: Payer: Self-pay | Admitting: Family Medicine

## 2020-03-09 ENCOUNTER — Ambulatory Visit: Payer: Self-pay | Attending: Family Medicine | Admitting: Family

## 2020-03-09 ENCOUNTER — Encounter: Payer: Self-pay | Admitting: Family

## 2020-03-09 ENCOUNTER — Other Ambulatory Visit: Payer: Self-pay

## 2020-03-09 DIAGNOSIS — Z794 Long term (current) use of insulin: Secondary | ICD-10-CM

## 2020-03-09 DIAGNOSIS — Z789 Other specified health status: Secondary | ICD-10-CM

## 2020-03-09 DIAGNOSIS — E1142 Type 2 diabetes mellitus with diabetic polyneuropathy: Secondary | ICD-10-CM

## 2020-03-09 LAB — POCT GLYCOSYLATED HEMOGLOBIN (HGB A1C): Hemoglobin A1C: 7.6 % — AB (ref 4.0–5.6)

## 2020-03-09 LAB — POCT CBG (FASTING - GLUCOSE)-MANUAL ENTRY: Glucose Fasting, POC: 179 mg/dL — AB (ref 70–99)

## 2020-03-09 MED ORDER — INSULIN GLARGINE 100 UNIT/ML ~~LOC~~ SOLN: 28.0000 [IU] | Freq: Every day | SUBCUTANEOUS | 4 refills | Status: DC

## 2020-03-09 NOTE — Patient Instructions (Addendum)
Aumente Lantus para la diabetes. Laboratorio hoy. Derivacin a Oftalmologa. Seguimiento con el farmacutico clnico en 1 mes para control de diabetes. Haga un seguimiento con el mdico de atencin primaria en 3 meses o antes si es necesario.   Increase Lantus for diabetes. Lab today. Referral to Ophthalmology. Follow-up with clinical pharmacist in 1 month for diabetes check. Follow-up with primary physician in 3 months or sooner if needed. Diabetes Basics  Diabetes (diabetes mellitus) is a long-term (chronic) disease. It occurs when the body does not properly use sugar (glucose) that is released from food after you eat. Diabetes may be caused by one or both of these problems:  Your pancreas does not make enough of a hormone called insulin.  Your body does not react in a normal way to insulin that it makes. Insulin lets sugars (glucose) go into cells in your body. This gives you energy. If you have diabetes, sugars cannot get into cells. This causes high blood sugar (hyperglycemia). Follow these instructions at home: How is diabetes treated? You may need to take insulin or other diabetes medicines daily to keep your blood sugar in balance. Take your diabetes medicines every day as told by your doctor. List your diabetes medicines here: Diabetes medicines  Name of medicine: ______________________________ ? Amount (dose): _______________ Time (a.m./p.m.): _______________ Notes: ___________________________________  Name of medicine: ______________________________ ? Amount (dose): _______________ Time (a.m./p.m.): _______________ Notes: ___________________________________  Name of medicine: ______________________________ ? Amount (dose): _______________ Time (a.m./p.m.): _______________ Notes: ___________________________________ If you use insulin, you will learn how to give yourself insulin by injection. You may need to adjust the amount based on the food that you eat. List the types of insulin  you use here: Insulin  Insulin type: ______________________________ ? Amount (dose): _______________ Time (a.m./p.m.): _______________ Notes: ___________________________________  Insulin type: ______________________________ ? Amount (dose): _______________ Time (a.m./p.m.): _______________ Notes: ___________________________________  Insulin type: ______________________________ ? Amount (dose): _______________ Time (a.m./p.m.): _______________ Notes: ___________________________________  Insulin type: ______________________________ ? Amount (dose): _______________ Time (a.m./p.m.): _______________ Notes: ___________________________________  Insulin type: ______________________________ ? Amount (dose): _______________ Time (a.m./p.m.): _______________ Notes: ___________________________________ How do I manage my blood sugar?  Check your blood sugar levels using a blood glucose monitor as directed by your doctor. Your doctor will set treatment goals for you. Generally, you should have these blood sugar levels:  Before meals (preprandial): 80-130 mg/dL (1.6-1.0 mmol/L).  After meals (postprandial): below 180 mg/dL (10 mmol/L).  A1c level: less than 7%. Write down the times that you will check your blood sugar levels: Blood sugar checks  Time: _______________ Notes: ___________________________________  Time: _______________ Notes: ___________________________________  Time: _______________ Notes: ___________________________________  Time: _______________ Notes: ___________________________________  Time: _______________ Notes: ___________________________________  Time: _______________ Notes: ___________________________________  What do I need to know about low blood sugar? Low blood sugar is called hypoglycemia. This is when blood sugar is at or below 70 mg/dL (3.9 mmol/L). Symptoms may include:  Feeling: ? Hungry. ? Worried or nervous (anxious). ? Sweaty and  clammy. ? Confused. ? Dizzy. ? Sleepy. ? Sick to your stomach (nauseous).  Having: ? A fast heartbeat. ? A headache. ? A change in your vision. ? Tingling or no feeling (numbness) around the mouth, lips, or tongue. ? Jerky movements that you cannot control (seizure).  Having trouble with: ? Moving (coordination). ? Sleeping. ? Passing out (fainting). ? Getting upset easily (irritability). Treating low blood sugar To treat low blood sugar, eat or drink something sugary right away. If you can think clearly and swallow  safely, follow the 15:15 rule:  Take 15 grams of a fast-acting carb (carbohydrate). Talk with your doctor about how much you should take.  Some fast-acting carbs are: ? Sugar tablets (glucose pills). Take 3-4 glucose pills. ? 6-8 pieces of hard candy. ? 4-6 oz (120-150 mL) of fruit juice. ? 4-6 oz (120-150 mL) of regular (not diet) soda. ? 1 Tbsp (15 mL) honey or sugar.  Check your blood sugar 15 minutes after you take the carb.  If your blood sugar is still at or below 70 mg/dL (3.9 mmol/L), take 15 grams of a carb again.  If your blood sugar does not go above 70 mg/dL (3.9 mmol/L) after 3 tries, get help right away.  After your blood sugar goes back to normal, eat a meal or a snack within 1 hour. Treating very low blood sugar If your blood sugar is at or below 54 mg/dL (3 mmol/L), you have very low blood sugar (severe hypoglycemia). This is an emergency. Do not wait to see if the symptoms will go away. Get medical help right away. Call your local emergency services (911 in the [place] Do not drive yourself to the hospital. Questions to ask your health care provider  Do I need to meet with a diabetes educator?  What equipment will I need to care for myself at home?  What diabetes medicines do I need? When should I take them?  How often do I need to check my blood sugar?  What number can I call if I have questions?  When is my next doctor's  visit?  Where can I find a support group for people with diabetes? Where to find more information  American Diabetes Association: www.diabetes.org  American Association of Diabetes Educators: www.diabeteseducator.org/patient-resources Contact a doctor if:  Your blood sugar is at or above 240 mg/dL (30.0 mmol/L) for 2 days in a row.  You have been sick or have had a fever for 2 days or more, and you are not getting better.  You have any of these problems for more than 6 hours: ? You cannot eat or drink. ? You feel sick to your stomach (nauseous). ? You throw up (vomit). ? You have watery poop (diarrhea). Get help right away if:  Your blood sugar is lower than 54 mg/dL (3 mmol/L).  You get confused.  You have trouble: ? Thinking clearly. ? Breathing. Summary  Diabetes (diabetes mellitus) is a long-term (chronic) disease. It occurs when the body does not properly use sugar (glucose) that is released from food after digestion.  Take insulin and diabetes medicines as told.  Check your blood sugar every day, as often as told.  Keep all follow-up visits as told by your doctor. This is important. This information is not intended to replace advice given to you by your health care provider. Make sure you discuss any questions you have with your health care provider. Document Revised: 05/05/2019 Document Reviewed: 11/14/2017 Elsevier Patient Education  2020 [employer].

## 2020-03-09 NOTE — Progress Notes (Signed)
Here for f /u on her DM

## 2020-03-09 NOTE — Progress Notes (Signed)
Patient ID: Nancy Stephenson, female    DOB: Aug 28, 1960  MRN: [id number]  CC: Diabetes Follow-Up  Subjective: Nancy Stephenson is a 59 y.o. female with history of type 2 diabetes mellitus with hyperglycemia without long-term use of insulin who presents for diabetes follow-up.   1. DIABETES TYPE 2 FOLLOW-UP: Last A1C:  7.6% on 03/09/2020 Are you fasting today: [x]  Yes, egg with cauliflower, water 11 am   Have you taken your anti-diabetic medications today: []  Yes [x]  No  Med Adherence:  [x]  Yes    []  No Medication side effects:  []  Yes    [x]  No  Home Monitoring?  []  Yes    []  No Home glucose results range: < 100 in morning and 140-180 in the evening Diet Adherence: [x]  Yes    []  No Exercise: [x]  Yes    []  No Hypoglycemic episodes?: []  Yes    [x]  No Numbness of the feet? []  Yes    [x]  No Retinopathy hx? []  Yes    [x]  No Last eye exam:  Can not remember  Comments: Last visit 10/11/2019 with Dr. Chapman Fitch. During that encounter provided refills of current mediations for the treatment of diabetes including Lantus along with new prescription for Gabapentin to help with peripheral neuropathy. Made aware that the medication may cause dizziness and she should initially start with 1 or 2 pills at bedtime and then advance to daytime use after a week if tolerated. CMP and hemoglobin A1C obtained.     Patient Active Problem List   Diagnosis Date Noted   Type 2 diabetes mellitus with hyperglycemia, without long-term current use of insulin ([place]) 09/18/2018   Pyelonephritis 09/18/2018     Current Outpatient Medications on File Prior to Visit  Medication Sig Dispense Refill   blood glucose meter kit and supplies KIT Dispense based on patient and insurance preference. Use up to four times daily as directed. (FOR ICD-9 250.00, 250.01). 1 each 0   Blood Glucose Monitoring Suppl (TRUE METRIX AIR GLUCOSE METER) w/Device KIT 1 kit by Does not apply route 4 (four) times daily -  before meals  and at bedtime. 1 kit 0   ferrous sulfate 325 (65 FE) MG tablet Take 1 tablet (325 mg total) by mouth daily with breakfast. 90 tablet 1   gabapentin (NEURONTIN) 100 MG capsule Take one pill in the morning, one in the afternoon and two at bedtime 120 capsule 6   glucose blood (TRUE METRIX BLOOD GLUCOSE TEST) test strip Use as instructed to check morning blood sugar before eating, 2 hours after largest meal of the day and before bedtime 100 each 12   insulin glargine (LANTUS) 100 UNIT/ML injection Inject 0.25 mLs (25 Units total) into the skin daily. 10 mL 4   Insulin Syringes, Disposable, U-100 0.3 ML MISC Use daily for lantus injections 100 each 3   Lancets (ACCU-CHEK MULTICLIX) lancets Use as directed 100 each 5   olopatadine (PATANOL) 0.1 % ophthalmic solution Place 1 drop into the right eye 2 (two) times daily. (Patient not taking: Reported on 10/11/2019) 5 mL 12   TRUEplus Lancets 28G MISC Use to check blood sugars 100 each 11   No current facility-administered medications on file prior to visit.    Allergies  Allergen Reactions   Fish Allergy Shortness Of Breath, Itching and Swelling   Shellfish-Derived Products Shortness Of Breath, Itching and Swelling    Social History   Socioeconomic History   Marital status: Married  Spouse name: Not on file   Number of children: Not on file   Years of education: Not on file   Highest education level: Not on file  Occupational History   Not on file  Tobacco Use   Smoking status: Never Smoker   Smokeless tobacco: Never Used  Vaping Use   Vaping Use: Never used  Substance and Sexual Activity   Alcohol use: Never   Drug use: Never   Sexual activity: Yes  Other Topics Concern   Not on file  Social History Narrative   Not on file   Social Determinants of Health   Financial Resource Strain:    Difficulty of Paying Living Expenses:   Food Insecurity:    Worried About Charity fundraiser in the Last Year:      Arboriculturist in the Last Year:   Transportation Needs:    Film/video editor (Medical):    Lack of Transportation (Non-Medical):   Physical Activity:    Days of Exercise per Week:    Minutes of Exercise per Session:   Stress:    Feeling of Stress :   Social Connections:    Frequency of Communication with Friends and Family:    Frequency of Social Gatherings with Friends and Family:    Attends Religious Services:    Active Member of Clubs or Organizations:    Attends Music therapist:    Marital Status:   Intimate Partner Violence:    Fear of Current or Ex-Partner:    Emotionally Abused:    Physically Abused:    Sexually Abused:     Family History  Problem Relation Age of Onset   Hypertension Sister    Hypertension Brother     Past Surgical History:  Procedure Laterality Date   CESAREAN SECTION      ROS: Review of Systems Negative except as stated above  PHYSICAL EXAM: Vitals with BMI 03/09/2020 10/08/2018 10/02/2018  Height 5' 0"  5' 0"  5' 0"   Weight 134 lbs 110 lbs 10 oz 113 lbs  BMI 26.17 01.0 27.25  Systolic 366 440 347  Diastolic 87 80 76  Pulse 87 78 82  SpO2- 97%, room air  Temperature- 98.60F, oral  Physical Exam  General appearance - alert, well appearing, and in no distress and oriented to person, place, and time Mental status - alert, oriented to person, place, and time, normal mood, behavior, speech, dress, motor activity, and thought processes Neck - supple, no significant adenopathy Lymphatics - no palpable lymphadenopathy, no hepatosplenomegaly Chest - clear to auscultation, no wheezes, rales or rhonchi, symmetric air entry, no tachypnea, retractions or cyanosis Extremities - peripheral pulses normal, no pedal edema, no clubbing or cyanosis  Results for orders placed or performed in visit on 03/09/20  POCT glycosylated hemoglobin (Hb A1C)  Result Value Ref Range   Hemoglobin A1C 7.6 (A) 4.0 - 5.6 %   HbA1c  POC (<> result, manual entry)     HbA1c, POC (prediabetic range)     HbA1c, POC (controlled diabetic range)    POCT CBG (Fasting - Glucose)  Result Value Ref Range   Glucose Fasting, POC 179 (A) 70 - 99 mg/dL    ASSESSMENT AND PLAN: 1. Type 2 diabetes mellitus with diabetic polyneuropathy, with long-term current use of insulin ([place]): -Hemoglobin A1C not at goal at 7.6%, goal < 7%. Blood sugar higher than expected, patient non-fasting and asymptomatic.  -Patient reports she is intolerant to oral antidiabetic medications. -Increase  Lantus from 25 units daily to 28 units daily. Follow-up with clinical pharmacist in 4 weeks for diabetes checkup at which time medication may need to adjusted. Write your home blood sugar results down each day and bring those results to your appointment along with your home glucose monitor. -To achieve an A1C goal of less than or equal to 7.0 percent, a fasting blood sugar of 80 to 130 mg/dL and a postprandial glucose (90 to 120 minutes after a meal) less than 180 mg/dL. In the event of sugars less than 60 mg/dl or greater than 400 mg/dl please notify the clinic ASAP. It is recommended that you undergo annual eye exams and annual foot exams. -Discussed the importance of healthy eating habits, low-carbohydrate diet, low-sugar diet, regular aerobic exercise (at least 150 minutes a week as tolerated) and medication compliance to achieve or maintain control of diabetes. -BMP to evaluate kidney function and electrolyte balance.  -Microalbumin/creatinine ratio to evaluate kidney function. -Follow-up with primary physician in 3 months or sooner if needed. -Referral to Ophthalmology for diabetic eye exam. - POCT glycosylated hemoglobin (Hb A1C) - POCT CBG (Fasting - Glucose) - Basic Metabolic Panel - Microalbumin / creatinine urine ratio - insulin glargine (LANTUS) 100 UNIT/ML injection; Inject 0.28 mLs (28 Units total) into the skin daily.  Dispense: 10 mL; Refill: 4 -  Ambulatory referral to Ophthalmology - Amb Referral to Clinical Pharmacist  2. Language barrier: English as a second language teacher participated during this visit. Interpreter Name: Chiquita Loth, ID#: [id number].   Patient was given the opportunity to ask questions.  Patient verbalized understanding of the plan and was able to repeat key elements of the plan. Patient was given clear instructions to go to Emergency Department or return to medical center if symptoms don't improve, worsen, or new problems develop.The patient verbalized understanding.   Camillia Herter, NP

## 2020-03-10 MED FILL — TRUEPLUS SYR 0.5ML 30GX5/16: 30G X 5/16" | 100 days supply | Qty: 100 | Fill #1

## 2020-03-10 MED FILL — TRUE METRIX TEST STRIP: 25 days supply | Qty: 100 | Fill #2

## 2020-03-10 MED FILL — GABAPENTIN 100 MG CAPSULE: 100 | 30 days supply | Qty: 120 | Fill #3

## 2020-03-10 MED FILL — TRUEplus LANCETS 28G MISC: 25 days supply | Qty: 100 | Fill #2

## 2020-03-10 MED FILL — $LANTUS 100 UNITS/ML VIAL: 100 | 28 days supply | Qty: 10 | Fill #0

## 2020-03-10 NOTE — Progress Notes (Signed)
CBG and hemoglobin A1C discussed in clinic.

## 2020-03-11 LAB — MICROALBUMIN / CREATININE URINE RATIO
Creatinine, Urine: 80 mg/dL
Microalb/Creat Ratio: 119 mg/g creat — ABNORMAL HIGH (ref 0–29)
Microalbumin, Urine: 95.1 ug/mL

## 2020-03-11 LAB — BASIC METABOLIC PANEL
BUN/Creatinine Ratio: 16 (ref 9–23)
BUN: 16 mg/dL (ref 6–24)
CO2: 23 mmol/L (ref 20–29)
Calcium: 9.4 mg/dL (ref 8.7–10.2)
Chloride: 103 mmol/L (ref 96–106)
Creatinine, Ser: 0.99 mg/dL (ref 0.57–1.00)
GFR calc Af Amer: 73 mL/min/{1.73_m2} (ref >59)
GFR calc non Af Amer: 63 mL/min/{1.73_m2} (ref >59)
Glucose: 222 mg/dL — ABNORMAL HIGH (ref 65–99)
Potassium: 4 mmol/L (ref 3.5–5.2)
Sodium: 141 mmol/L (ref 134–144)

## 2020-03-13 NOTE — Progress Notes (Signed)
Please call patient with update.   You have protein spilling over into the urine. This could mean that diabetes is affecting the kidneys. Good diabetes control will help. Follow-up with primary physician as scheduled.

## 2020-03-14 NOTE — Addendum Note (Signed)
Addended by: Rema Fendt on: 03/14/2020 04:25 PM   Modules accepted: Orders

## 2020-03-14 NOTE — Progress Notes (Signed)
Will recheck microalbumin in 6 months. Order placed for future.

## 2020-04-18 NOTE — Progress Notes (Signed)
    S:     PCP: Dr. Jillyn Hidden  Patient arrives in good spirits.  Presents for diabetes evaluation, education, and management. Patient was referred and last seen by Ricky Stabs on 03/09/20. Amy increased Lantus to 28 units daily. Pt reported intolerance to oral antihyperglycemic agents.   PMH: T2DM  Family/Social History:  -Fhx: HTN in brother and sister -Tobacco: denies tobacco abuse -Alcohol: denies  Insurance coverage/medication affordability: None  Medication adherence reported.   Current diabetes medications include: Lantus 28 units daily Currently does not take a statin.  Currently does not take an ACEi or ARB.   Previously tried diabetes medications: glipizide 5 mg BID, metformin 1000 mg BID  Patient denies hypoglycemic events.  Patient reported dietary habits: admits to dietary indiscretion   Patient-reported exercise habits: none    Patient denies nocturia (nighttime urination).  Patient denies neuropathy (nerve pain). Patient denies visual changes. Patient reports self foot exams.     O:  POCT: 114  Home fasting blood sugars: reports low 100s  2 hour post-meal/random blood sugars: mostly 120s-180s per pt. A couple of outliers in low 200s. .  Lab Results  Component Value Date   HGBA1C 7.6 (A) 03/09/2020   Lipid Panel  No results found for: CHOL, TRIG, HDL, CHOLHDL, VLDL, LDLCALC, LDLDIRECT  Clinical Atherosclerotic Cardiovascular Disease (ASCVD): No  The ASCVD Risk score Denman George DC Jr., et al., 2013) failed to calculate for the following reasons:   Cannot find a previous HDL lab   Cannot find a previous total cholesterol lab    A/P: Diabetes longstanding currently uncontrolled but close to goal. Home glycemic control is improved. Her outliers are d/t dietary indiscretion per patient admission. Patient is able to verbalize appropriate hypoglycemia management plan. Medication adherence reported. She previously tried and was unable to tolerate metformin or  glipizide. Of note, she has an elevated urine microalbumin:creatinine ratio. Amenable to starting renal dose lisinopril today. -Continued Lantus 28 units daily.  -Start lisinopril 2.5 mg daily.  -Extensively discussed pathophysiology of diabetes, recommended lifestyle interventions, dietary effects on blood sugar control -Counseled on s/sx of and management of hypoglycemia -Next A1C anticipated 05/2020.   ASCVD risk - primary prevention in patient with diabetes. Needs lipid panel. ASCVD risk score cannot be calculated. At least moderate intensity statin indicated. LFTs from Feb WNL. Recommend to recheck at follow-up.  -Lipid -Started rosuvastatin 10 mg.    Written patient instructions provided.  Total time in face to face counseling 30 minutes.   Follow up Pharmacist Clinic Visit in 3 weeks.  Butch Penny, PharmD, CPP Clinical Pharmacist [facility] [phone]

## 2020-04-19 ENCOUNTER — Other Ambulatory Visit: Payer: Self-pay

## 2020-04-19 ENCOUNTER — Other Ambulatory Visit: Payer: Self-pay | Admitting: Family Medicine

## 2020-04-19 ENCOUNTER — Ambulatory Visit: Payer: Self-pay | Attending: Family Medicine | Admitting: Pharmacist

## 2020-04-19 ENCOUNTER — Encounter: Payer: Self-pay | Admitting: Pharmacist

## 2020-04-19 DIAGNOSIS — Z794 Long term (current) use of insulin: Secondary | ICD-10-CM

## 2020-04-19 DIAGNOSIS — Z23 Encounter for immunization: Secondary | ICD-10-CM

## 2020-04-19 DIAGNOSIS — E1142 Type 2 diabetes mellitus with diabetic polyneuropathy: Secondary | ICD-10-CM

## 2020-04-19 LAB — GLUCOSE, POCT (MANUAL RESULT ENTRY): POC Glucose: 114 mg/dl — AB (ref 70–99)

## 2020-04-19 MED ORDER — LISINOPRIL 2.5 MG PO TABS: 2.5000 mg | ORAL_TABLET | Freq: Every day | ORAL | 2 refills | Status: DC

## 2020-04-19 MED ORDER — ROSUVASTATIN CALCIUM 10 MG PO TABS: 10.0000 mg | ORAL_TABLET | Freq: Every day | ORAL | 2 refills | Status: DC

## 2020-04-19 MED FILL — TRUE METRIX TEST STRIP: 25 days supply | Qty: 100 | Fill #3

## 2020-04-19 MED FILL — $LANTUS 100 UNITS/ML VIAL: 100 | 28 days supply | Qty: 10 | Fill #1

## 2020-04-19 MED FILL — GABAPENTIN 100 MG CAPSULE: 100 | 30 days supply | Qty: 120 | Fill #4

## 2020-04-19 MED FILL — LISINOPRIL 2.5 MG TABLET: 2.5 | 30 days supply | Qty: 30 | Fill #0

## 2020-04-19 MED FILL — ROSUVASTATIN CALCIUM 10 MG: 10 | 30 days supply | Qty: 30 | Fill #0

## 2020-04-19 MED FILL — TRUEplus LANCETS 28G MISC: 25 days supply | Qty: 100 | Fill #3

## 2020-04-19 NOTE — Patient Instructions (Signed)
Spanish instructions:   1. Contine Lantus 28 unidades diarias. 2. Inicie lisinopril. Tomar 1 tableta al da. 3. Comience con rosuvastatina. Tomar 1 tableta al da. 4. Contine controlando los niveles de Transport planner. 5. Contine haciendo los cambios de estilo de vida que discutimos juntos durante nuestra visita. 6. Haz un seguimiento conmigo en 3 semanas.       Thank you for coming to see me today. Please do the following:  1. Continue Lantus 28 units daily.  2. Start lisinopril. Take 1 tablet daily.  3. Start rosuvastatin. Take 1 tablet daily.  4. Continue checking blood sugars at home. 5. Continue making the lifestyle changes we've discussed together during our visit.  6. Follow-up with me in 3 weeks.   Hypoglycemia or low blood sugar:   Low blood sugar can happen quickly and may become an emergency if not treated right away.   While this shouldn't happen often, it can be brought upon if you skip a meal or do not eat enough. Also, if your insulin or other diabetes medications are dosed too high, this can cause your blood sugar to go to low.   Warning signs of low blood sugar include: 1. Feeling shaky or dizzy 2. Feeling weak or tired  3. Excessive hunger 4. Feeling anxious or upset  5. Sweating even when you aren't exercising  What to do if I experience low blood sugar? 1. Check your blood sugar with your meter. If lower than 70, proceed to step 2.  2. Treat with 3-4 glucose tablets or 3 packets of regular sugar. If these aren't around, you can try hard candy. Yet another option would be to drink 4 ounces of fruit juice or 6 ounces of REGULAR soda.  3. Re-check your sugar in 15 minutes. If it is still below 70, do what you did in step 2 again. If has come back up, go ahead and eat a snack or small meal at this time.

## 2020-05-09 NOTE — Progress Notes (Signed)
    S:     PCP: Dr. Jillyn Hidden  PMH: T2DM  Patient arrives in good spirits for 3-week DM follow-up. Patient was referred and last seen by Ricky Stabs on 03/09/20 and last seen in pharmacy clinic on 04/19/20. At that visit, patient continued on lantus 28 units daily. Lisinopril 2.5 mg daily started for renal protection and rosuvastatin 10 mg started from primary prevention.     Today, patient reports medication adherence with Lantus 28 units daily, lisinopril 2.5 mg daily, and rosuvastatin 10 mg daily - all in the evenings. Denies any side effects. Reports fasting AM sugars range from 80s-130s with a low of 70 and fasting PM sugars range from 90s-200s with a high 233 (see below). Denies symptomatic hypoglycemia.  Family/Social History:  -Fhx: HTN in brother and sister -Tobacco: denies tobacco abuse -Alcohol: denies  Insurance coverage/medication affordability: None  Medication adherence reported. Current diabetes medications include: Lantus 28 units daily Current hypertension medications include: lisinopril 2.5 mg daily Currently hyperlipidemia medications include: rosuvastatin 10 mg daily   Previously tried diabetes medications: glipizide 5 mg BID, metformin 1000 mg BID (reports intolerance to oral antihyperglycemic agents)   Patient denies hypoglycemic events.  Patient reported dietary habits: admits to dietary indiscretion  Breakfast: coffee and a cookie Lunch/dinner: enchiladas, chicken, salad, beef Drinks: Coffee and water  Patient-reported exercise habits: none    Patient denies nocturia (nighttime urination).  Patient reports neuropathy (nerve pain). Patient denies visual changes.   O:   Fasting AM: 93, 101, 89, 95, 84, 70, 107, 110, 107, 95, 112, 93, 137, 111, 85 Fasting PM: 97, 127, 94, 231, 233, 216, 171, 198, 146, 231, 137, 110, 142  POCT: 207 (post-prandial)  Lab Results  Component Value Date   HGBA1C 7.6 (A) 03/09/2020   There were no vitals filed for this  visit.  Lipid Panel  No results found for: CHOL, TRIG, HDL, CHOLHDL, VLDL, LDLCALC, LDLDIRECT  Clinical Atherosclerotic Cardiovascular Disease (ASCVD): No  The ASCVD Risk score Denman George DC Jr., et al., 2013) failed to calculate for the following reasons:   Cannot find a previous HDL lab   Cannot find a previous total cholesterol lab   A/P: Diabetes longstanding currently uncontrolled but A1C (7.6%) approaching goal. Home BG sugars continue to improve with a few 200s in the afternoon. Patient continues to report outliers that are d/t dietary indiscretion. Medication adherence reported. Discussed intiating a GLP-1 agonist to reduce risk of hypoglycemia while improving evening sugars, however patient hesitant on adding another agent. Patient amenable to following up in 4 weeks to assess BG and check A1C. Will consider Victoza if DM remains uncontrolled. No medication changes today. -Continued Lantus 28 units daily (PM) -Extensively discussed pathophysiology of diabetes, recommended lifestyle interventions, dietary effects on blood sugar control -Counseled on s/sx of and management of hypoglycemia -Next A1C anticipated October 2021.   ASCVD risk - primary prevention in patient with diabetes. Last LDL unknown. ASCVD risk score cannot be calculated. At least moderate intensity statin indicated. LFTs from Feb WNL -Continued Rosuvastatin 10 mg daily -Check lipid panel at next visit  Written patient instructions provided. Total time in face to face counseling 30 minutes.   Follow up Pharmacist Clinic Visit in 4 weeks.    Fabio Neighbors, PharmD PGY2 Ambulatory Care Resident [facility]  Pharmacy

## 2020-05-10 ENCOUNTER — Ambulatory Visit: Payer: Self-pay | Attending: Family Medicine | Admitting: Pharmacist

## 2020-05-10 ENCOUNTER — Other Ambulatory Visit: Payer: Self-pay

## 2020-05-10 ENCOUNTER — Encounter: Payer: Self-pay | Admitting: Pharmacist

## 2020-05-10 DIAGNOSIS — E1142 Type 2 diabetes mellitus with diabetic polyneuropathy: Secondary | ICD-10-CM

## 2020-05-10 DIAGNOSIS — Z794 Long term (current) use of insulin: Secondary | ICD-10-CM

## 2020-05-10 LAB — GLUCOSE, POCT (MANUAL RESULT ENTRY): POC Glucose: 207 mg/dL — AB (ref 70–99)

## 2020-05-19 MED FILL — TRUEplus LANCETS 28G MISC: 25 days supply | Qty: 100 | Fill #4

## 2020-05-19 MED FILL — $LANTUS 100 UNITS/ML VIAL: 100 | 84 days supply | Qty: 30 | Fill #2

## 2020-05-19 MED FILL — LISINOPRIL 2.5 MG TABLET: 2.5 | 30 days supply | Qty: 30 | Fill #1

## 2020-05-19 MED FILL — ROSUVASTATIN CALCIUM 10 MG: 10 | 30 days supply | Qty: 30 | Fill #1

## 2020-05-19 MED FILL — TRUE METRIX TEST STRIP: 25 days supply | Qty: 100 | Fill #4

## 2020-05-19 MED FILL — GABAPENTIN 100 MG CAPSULE: 100 | 30 days supply | Qty: 120 | Fill #5

## 2020-06-06 NOTE — Progress Notes (Signed)
    S:     PCP: Dr. Jillyn Hidden  PMH: T2DM  Patient arrives in good spirits for 17-month diabetes follow-up visit. Patient was referred and last seen byAmy Carmela Hurt 03/09/20 and last seen in pharmacy clinic on 05/10/20 in which patient continued on lantus 28 units daily.    Today, patient reports medication adherence with Lantus 28 units daily. Denies any missed doses. Denies symptomatic hypoglycemia. Reports sugars have been good - fasting 90s-130s and after meals 90s-180s with a few in the 200s.   Family/Social History: -Fhx: HTN in brother and sister -Tobacco: denies tobacco abuse -Alcohol: denies  Insurance coverage/medication affordability: self-pay  Medication adherence reported.   Current diabetes medications include:Lantus 28 units daily (PM) Current hypertension medications include: lisinopril 2.5 mg daily Currently hyperlipidemia medications include: rosuvastatin 10 mg daily   Previously tried diabetes medications: glipizide 5 mg BID, metformin 1000 mg BID (reports intolerance to oral antihyperglycemic agents)   Patient denies hypoglycemic events.  Patient reported dietary habits:admits to dietary indiscretion Breakfast: coffee and a cookie Lunch/dinner: enchiladas, chicken, salad, beef Drinks: Coffee and water  Patient-reported exercise habits:none   Patient denies nocturia (nighttime urination).  Patient denies neuropathy (nerve pain). Patient denies visual changes.   O:   Home fasting blood sugars: 93, 101, 89, 107, 90, 91, 93, 71, 113, 109, 130, 132, 130, 102, 118, 103, 119, 130, 144, 89 2 hour post-meal/random blood sugars: 97, 127, 207, 141, 144, 181, 146, 116, 122, 95, 80, 100, 230, 186, 127, 148, 134, 112, 165, 159, 229, 181, 123, 173  Lab Results  Component Value Date   HGBA1C 7.1 (A) 06/07/2020   There were no vitals filed for this visit.  Lipid Panel  No results found for: CHOL, TRIG, HDL, CHOLHDL, VLDL, LDLCALC, LDLDIRECT  Clinical  Atherosclerotic Cardiovascular Disease (ASCVD): No  The ASCVD Risk score Denman George DC Jr., et al., 2013) failed to calculate for the following reasons:   Cannot find a previous HDL lab   Cannot find a previous total cholesterol lab   A/P: Diabetes - Updated A1C today is 7.1% which has improved from 7.6% in July 2021. Congratulated patient on this achievement. Medication adherence appears optimal as overall home sugars have been within target goals. Encouraged patient to continue checking sugars, eating a healthy diet, and exercising. Will make no medication changes today. -Continued Lantus 28 units daily  -A1c -Urine microalbumin:Scr ratio -Extensively discussed pathophysiology of diabetes, recommended lifestyle interventions, dietary effects on blood sugar control -Counseled on s/sx of and management of hypoglycemia -Next A1C anticipated January 2021.   ASCVD risk - primary prevention in patient with diabetes. Last LDL unknown. ASCVD risk score cannot be calculated. At least moderateintensity statin indicated. LFTs from Feb WNL -Continued Rosuvastatin 10 mg today -Will check lipid panel today  Written patient instructions provided.  Total time in face to face counseling 25 minutes.   Follow up PCP Clinic Visit on November 9th.    Fabio Neighbors, PharmD PGY2 Ambulatory Care Resident [facility]  Pharmacy

## 2020-06-07 ENCOUNTER — Ambulatory Visit: Payer: Self-pay | Attending: Family Medicine | Admitting: Pharmacist

## 2020-06-07 ENCOUNTER — Encounter: Payer: Self-pay | Admitting: Pharmacist

## 2020-06-07 ENCOUNTER — Other Ambulatory Visit: Payer: Self-pay

## 2020-06-07 DIAGNOSIS — Z794 Long term (current) use of insulin: Secondary | ICD-10-CM

## 2020-06-07 DIAGNOSIS — E1142 Type 2 diabetes mellitus with diabetic polyneuropathy: Secondary | ICD-10-CM

## 2020-06-07 LAB — POCT GLYCOSYLATED HEMOGLOBIN (HGB A1C): Hemoglobin A1C: 7.1 % — AB (ref 4.0–5.6)

## 2020-06-08 LAB — LIPID PANEL
Chol/HDL Ratio: 2.8 ratio (ref 0.0–4.4)
Cholesterol, Total: 144 mg/dL (ref 100–199)
HDL: 51 mg/dL (ref >39)
LDL Chol Calc (NIH): 75 mg/dL (ref 0–99)
Triglycerides: 96 mg/dL (ref 0–149)
VLDL Cholesterol Cal: 18 mg/dL (ref 5–40)

## 2020-06-08 LAB — MICROALBUMIN / CREATININE URINE RATIO
Creatinine, Urine: 11.8 mg/dL
Microalb/Creat Ratio: 126 mg/g creat — ABNORMAL HIGH (ref 0–29)
Microalbumin, Urine: 14.9 ug/mL

## 2020-06-12 ENCOUNTER — Ambulatory Visit: Payer: Self-pay | Admitting: Family Medicine

## 2020-06-15 ENCOUNTER — Telehealth: Payer: Self-pay

## 2020-06-15 NOTE — Telephone Encounter (Signed)
-----   Message from Cain Saupe, MD sent at 06/14/2020  9:18 AM EDT ----- Cholesterol panel near normal, LDL of 75 with goal of 70 or less as patient is diabetic-continue cholesterol medication. Microalbumin/creatinine ratio is above normal- continue to make sure that blood sugars and blood pressure remain well controlled-continue lisinopril for kidney protection. Hgb A1c of 7.1, near goal of 7 or less.

## 2020-06-15 NOTE — Telephone Encounter (Signed)
Att to contact pt about lab results, no ans lvm. Letter being sent since unable to reach pt

## 2020-06-19 MED FILL — LISINOPRIL 2.5 MG TABLET: 2.5 | 30 days supply | Qty: 30 | Fill #2

## 2020-06-19 MED FILL — GABAPENTIN 100 MG CAPSULE: 100 | 30 days supply | Qty: 120 | Fill #6

## 2020-06-19 MED FILL — TRUE METRIX TEST STRIP: 25 days supply | Qty: 100 | Fill #5

## 2020-06-19 MED FILL — ROSUVASTATIN CALCIUM 10 MG: 10 | 30 days supply | Qty: 30 | Fill #2

## 2020-06-19 MED FILL — TRUEplus LANCETS 28G MISC: 25 days supply | Qty: 100 | Fill #5

## 2020-07-04 ENCOUNTER — Ambulatory Visit: Payer: Self-pay | Attending: Family Medicine | Admitting: Family Medicine

## 2020-07-04 ENCOUNTER — Other Ambulatory Visit: Payer: Self-pay

## 2020-07-04 ENCOUNTER — Other Ambulatory Visit: Payer: Self-pay | Admitting: Family Medicine

## 2020-07-04 ENCOUNTER — Encounter: Payer: Self-pay | Admitting: Family Medicine

## 2020-07-04 DIAGNOSIS — E1142 Type 2 diabetes mellitus with diabetic polyneuropathy: Secondary | ICD-10-CM

## 2020-07-04 DIAGNOSIS — E1129 Type 2 diabetes mellitus with other diabetic kidney complication: Secondary | ICD-10-CM

## 2020-07-04 DIAGNOSIS — Z794 Long term (current) use of insulin: Secondary | ICD-10-CM

## 2020-07-04 DIAGNOSIS — H1013 Acute atopic conjunctivitis, bilateral: Secondary | ICD-10-CM

## 2020-07-04 DIAGNOSIS — R809 Proteinuria, unspecified: Secondary | ICD-10-CM

## 2020-07-04 MED ORDER — OLOPATADINE HCL 0.1 % OP SOLN: 1.0000 [drp] | Freq: Two times a day (BID) | OPHTHALMIC | 12 refills | Status: DC

## 2020-07-04 MED ORDER — ROSUVASTATIN CALCIUM 10 MG PO TABS: 10.0000 mg | ORAL_TABLET | Freq: Every day | ORAL | 6 refills | Status: DC

## 2020-07-04 MED ORDER — INSULIN GLARGINE 100 UNIT/ML ~~LOC~~ SOLN: 28.0000 [IU] | Freq: Every day | SUBCUTANEOUS | 6 refills | Status: DC

## 2020-07-04 MED ORDER — LISINOPRIL 2.5 MG PO TABS: 2.5000 mg | ORAL_TABLET | Freq: Every day | ORAL | 6 refills | Status: DC

## 2020-07-04 MED ORDER — GABAPENTIN 100 MG PO CAPS: ORAL_CAPSULE | ORAL | 6 refills | Status: DC

## 2020-07-04 MED FILL — OLOPATADINE HCL 0.1 % SOLN: 0.1 | 37 days supply | Qty: 5 | Fill #0

## 2020-07-04 NOTE — Progress Notes (Signed)
Virtual Visit via Telephone Note  I connected with Nancy Grupe, on 07/04/2020 at 3:38 PM by telephone due to the COVID-19 pandemic and verified that I am speaking with the correct person using two identifiers.   Consent: I discussed the limitations, risks, security and privacy concerns of performing an evaluation and management service by telephone and the availability of in person appointments. I also discussed with the patient that there may be a patient responsible charge related to this service. The patient expressed understanding and agreed to proceed.   Location of Patient: Home  Location of Provider: Clinic   Persons participating in Telemedicine visit: BRIGGETT TUCCILLO [facility]-CMA Interpreter -Mardene Celeste ID #[id number] Dr. Margarita Rana     History of Present Illness: 59 year old female patient of Dr Chapman Fitch with a history of type 2 diabetes mellitus (A1c 7.1) microalbuminuria who is seen for chronic disease management.  She has been seeing the clinical pharmacist for optimization of her diabetes. She is on lisinopril for her microalbuminuria. Denies presence of hypoglycemia, numbness in extremities or visual concerns. Fasting 104-115; random sugars are around 160. Complains of tearing of her eyes and intermittent itching and is requesting eyedrops for this.  Past Medical History:  Diagnosis Date  . Diabetes mellitus without complication ([place])   . Pre-diabetes    Allergies  Allergen Reactions  . Fish Allergy Shortness Of Breath, Itching and Swelling  . Shellfish-Derived Products Shortness Of Breath, Itching and Swelling    Current Outpatient Medications on File Prior to Visit  Medication Sig Dispense Refill  . blood glucose meter kit and supplies KIT Dispense based on patient and insurance preference. Use up to four times daily as directed. (FOR ICD-9 250.00, 250.01). 1 each 0  . Blood Glucose Monitoring Suppl (TRUE METRIX AIR GLUCOSE METER) w/Device KIT  1 kit by Does not apply route 4 (four) times daily -  before meals and at bedtime. 1 kit 0  . ferrous sulfate 325 (65 FE) MG tablet Take 1 tablet (325 mg total) by mouth daily with breakfast. 90 tablet 1  . gabapentin (NEURONTIN) 100 MG capsule Take one pill in the morning, one in the afternoon and two at bedtime 120 capsule 6  . glucose blood (TRUE METRIX BLOOD GLUCOSE TEST) test strip Use as instructed to check morning blood sugar before eating, 2 hours after largest meal of the day and before bedtime 100 each 12  . insulin glargine (LANTUS) 100 UNIT/ML injection Inject 0.28 mLs (28 Units total) into the skin daily. 10 mL 4  . Insulin Syringes, Disposable, U-100 0.3 ML MISC Use daily for lantus injections 100 each 3  . Lancets (ACCU-CHEK MULTICLIX) lancets Use as directed 100 each 5  . rosuvastatin (CRESTOR) 10 MG tablet Take 1 tablet (10 mg total) by mouth daily. 30 tablet 2  . TRUEplus Lancets 28G MISC Use to check blood sugars 100 each 11  . lisinopril (ZESTRIL) 2.5 MG tablet Take 1 tablet (2.5 mg total) by mouth daily. (Patient not taking: Reported on 07/04/2020) 30 tablet 2  . olopatadine (PATANOL) 0.1 % ophthalmic solution Place 1 drop into the right eye 2 (two) times daily. (Patient not taking: Reported on 10/11/2019) 5 mL 12   No current facility-administered medications on file prior to visit.    Observations/Objective: Awake, alert, oriented x3 Not in acute distress  Lab Results  Component Value Date   HGBA1C 7.1 (A) 06/07/2020    CMP Latest Ref Rng & Units 03/10/2020 10/18/2019 07/16/2019  Glucose 65 - 99 mg/dL 222(H) 207(H) 191(H)  BUN 6 - 24 mg/dL 16 28(H) 24  Creatinine 0.57 - 1.00 mg/dL 0.99 1.17(H) 1.07(H)  Sodium 134 - 144 mmol/L 141 138 142  Potassium 3.5 - 5.2 mmol/L 4.0 4.6 3.8  Chloride 96 - 106 mmol/L 103 99 102  CO2 20 - 29 mmol/L _0 Calcium 8.7 - 10.2 mg/dL 9.4 9.1 8.8  Total Protein 6.0 - 8.5 g/dL - 6.8 6.6  Total Bilirubin 0.0 - 1.2 mg/dL - <0.2 0.3   Alkaline Phos 39 - 117 IU/L - 131(H) 114  AST 0 - 40 IU/L - 15 19  ALT 0 - 32 IU/L - 16 16    Lipid Panel     Component Value Date/Time   CHOL 144 06/07/2020 1559   TRIG 96 06/07/2020 1559   HDL 51 06/07/2020 1559   CHOLHDL 2.8 06/07/2020 1559   LDLCALC 75 06/07/2020 1559   LABVLDL 18 06/07/2020 1559     Assessment and Plan: 1. Type 2 diabetes mellitus with diabetic polyneuropathy, with long-term current use of insulin (HCC) Controlled with A1c of 7.1 Continue current regimen Counseled on Diabetic diet, my plate method, 116 minutes of moderate intensity exercise/week Blood sugar logs with fasting goals of 80-120 mg/dl, random of less than 180 and in the event of sugars less than 60 mg/dl or greater than 400 mg/dl encouraged to notify the clinic. Advised on the need for annual eye exams, annual foot exams, Pneumonia vaccine. - rosuvastatin (CRESTOR) 10 MG tablet; Take 1 tablet (10 mg total) by mouth daily.  Dispense: 30 tablet; Refill: 6 - insulin glargine (LANTUS) 100 UNIT/ML injection; Inject 0.28 mLs (28 Units total) into the skin daily.  Dispense: 10 mL; Refill: 6 - gabapentin (NEURONTIN) 100 MG capsule; Take one pill in the morning, one in the afternoon and two at bedtime  Dispense: 120 capsule; Refill: 6  2. Microalbuminuria due to type 2 diabetes mellitus (HCC) Continue ACE inhibitor - lisinopril (ZESTRIL) 2.5 MG tablet; Take 1 tablet (2.5 mg total) by mouth daily.  Dispense: 30 tablet; Refill: 6  3. Allergic conjunctivitis Placed on Patanol  Follow Up Instructions: 3 months for chronic disease management.   I discussed the assessment and treatment plan with the patient. The patient was provided an opportunity to ask questions and all were answered. The patient agreed with the plan and demonstrated an understanding of the instructions.   The patient was advised to call back or seek an in-person evaluation if the symptoms worsen or if the condition fails to improve as  anticipated.     I provided 14 minutes total of non-face-to-face time during this encounter including median intraservice time, reviewing previous notes, investigations, ordering medications, medical decision making, coordinating care and patient verbalized understanding at the end of the visit.     Charlott Rakes, MD, FAAFP. [facility] and [facility], [place]   07/04/2020, 3:38 PM

## 2020-07-19 MED FILL — OLOPATADINE HCL 0.1 % SOLN: 0.1 | 37 days supply | Qty: 5 | Fill #0

## 2020-07-19 MED FILL — LISINOPRIL 2.5 MG TABLET: 2.5 | 30 days supply | Qty: 30 | Fill #0

## 2020-07-19 MED FILL — TRUE METRIX TEST STRIP: 30 days supply | Qty: 100 | Fill #6

## 2020-07-19 MED FILL — GABAPENTIN 100 MG CAPSULE: 100 | 30 days supply | Qty: 120 | Fill #0

## 2020-07-19 MED FILL — ROSUVASTATIN CALCIUM 10 MG: 10 | 30 days supply | Qty: 30 | Fill #0

## 2020-07-19 MED FILL — TRUEPLUS SYR 0.5ML 30GX5/16: 30G X 5/16" | 30 days supply | Qty: 100 | Fill #2

## 2020-07-19 MED FILL — TRUEplus LANCETS 28G MISC: 30 days supply | Qty: 100 | Fill #6

## 2020-08-28 MED FILL — GABAPENTIN 100 MG CAPSULE: 100 | 30 days supply | Qty: 120 | Fill #1

## 2020-08-28 MED FILL — ROSUVASTATIN CALCIUM 10 MG: 10 | 30 days supply | Qty: 30 | Fill #1

## 2020-08-28 MED FILL — TRUE METRIX GLUCOSE TEST ST: 30 days supply | Qty: 100 | Fill #7

## 2020-08-28 MED FILL — TRUEplus LANCETS 28G MISC: 30 days supply | Qty: 100 | Fill #7

## 2020-08-28 MED FILL — $LANTUS 100 UNITS/ML VIAL: 100 | 28 days supply | Qty: 10 | Fill #0

## 2020-08-28 MED FILL — TRUEPLUS SYR 0.5ML 30GX5/16: 30G X 5/16" | 30 days supply | Qty: 100 | Fill #3

## 2020-08-28 MED FILL — LISINOPRIL 2.5 MG TABLET: 2.5 | 30 days supply | Qty: 30 | Fill #1

## 2020-09-25 MED FILL — GABAPENTIN 100 MG CAPSULE: 100 | 30 days supply | Qty: 120 | Fill #2

## 2020-09-25 MED FILL — LISINOPRIL 2.5 MG TABLET: 2.5 | 30 days supply | Qty: 30 | Fill #2

## 2020-09-25 MED FILL — ?ROSUVASTATIN CALCIUM 10 MG: 10 | 30 days supply | Qty: 30 | Fill #2

## 2020-09-25 MED FILL — OLOPATADINE HCL 0.1 % SOLN: 0.1 | 37 days supply | Qty: 5 | Fill #1

## 2020-10-04 ENCOUNTER — Ambulatory Visit: Payer: Self-pay | Attending: Family Medicine | Admitting: Family Medicine

## 2020-10-04 ENCOUNTER — Other Ambulatory Visit: Payer: Self-pay | Admitting: Family Medicine

## 2020-10-04 ENCOUNTER — Other Ambulatory Visit: Payer: Self-pay

## 2020-10-04 ENCOUNTER — Encounter: Payer: Self-pay | Admitting: Family Medicine

## 2020-10-04 VITALS — BP 160/84 | HR 64 | Ht 60.0 in | Wt 142.4 lb

## 2020-10-04 DIAGNOSIS — E1142 Type 2 diabetes mellitus with diabetic polyneuropathy: Secondary | ICD-10-CM

## 2020-10-04 DIAGNOSIS — I152 Hypertension secondary to endocrine disorders: Secondary | ICD-10-CM

## 2020-10-04 DIAGNOSIS — E1129 Type 2 diabetes mellitus with other diabetic kidney complication: Secondary | ICD-10-CM

## 2020-10-04 DIAGNOSIS — R809 Proteinuria, unspecified: Secondary | ICD-10-CM

## 2020-10-04 DIAGNOSIS — Z794 Long term (current) use of insulin: Secondary | ICD-10-CM

## 2020-10-04 DIAGNOSIS — E1159 Type 2 diabetes mellitus with other circulatory complications: Secondary | ICD-10-CM

## 2020-10-04 LAB — POCT GLYCOSYLATED HEMOGLOBIN (HGB A1C): HbA1c, POC (controlled diabetic range): 7.7 % — AB (ref 0.0–7.0)

## 2020-10-04 LAB — GLUCOSE, POCT (MANUAL RESULT ENTRY): POC Glucose: 101 mg/dl — AB (ref 70–99)

## 2020-10-04 MED ORDER — LISINOPRIL 5 MG PO TABS: 5.0000 mg | ORAL_TABLET | Freq: Every day | ORAL | 1 refills | Status: DC

## 2020-10-04 MED ORDER — ROSUVASTATIN CALCIUM 10 MG PO TABS: 10.0000 mg | ORAL_TABLET | Freq: Every day | ORAL | 6 refills | Status: DC

## 2020-10-04 MED ORDER — INSULIN GLARGINE 100 UNIT/ML ~~LOC~~ SOLN
28.0000 [IU] | Freq: Every day | SUBCUTANEOUS | 6 refills | Status: AC
  Filled 2020-12-18: qty 10, 35d supply, fill #0
  Filled 2021-03-01: qty 10, 35d supply, fill #1
  Filled 2021-04-02: qty 30, 84d supply, fill #2
  Filled 2021-05-07: qty 10, 35d supply, fill #3

## 2020-10-04 MED ORDER — GABAPENTIN 100 MG PO CAPS: ORAL_CAPSULE | ORAL | 6 refills | Status: DC

## 2020-10-04 NOTE — Patient Instructions (Signed)

## 2020-10-04 NOTE — Progress Notes (Signed)
Subjective:  Patient ID: Nancy Stephenson, female    DOB: [birth year]  Age: 60 y.o. MRN: [id number]  CC: Diabetes   HPI Nancy Stephenson is a 60 year old female patient with a history of type 2 diabetes mellitus with microalbuminuria (A1c 7.7). She has upcoming cataract surgery in 2 weeks   A1c is 7.7 up from 7.1 previously and she endorses compliance with her medications and has not changed her diet. Sugars at home are 100-130 fasting and random are 150-180 Neuropathy is controlled on Gabapentin. She has not been exercising much due to the cold.  Her BP is elevated and she has no known history of Hypertension and has been on lisinopril for microalbuminuria. She has no additional concerns today. Past Medical History:  Diagnosis Date  . Diabetes mellitus without complication ([place])   . Pre-diabetes     Past Surgical History:  Procedure Laterality Date  . CESAREAN SECTION      Family History  Problem Relation Age of Onset  . Hypertension Sister   . Hypertension Brother     Allergies  Allergen Reactions  . Fish Allergy Shortness Of Breath, Itching and Swelling  . Shellfish-Derived Products Shortness Of Breath, Itching and Swelling    Outpatient Medications Prior to Visit  Medication Sig Dispense Refill  . blood glucose meter kit and supplies KIT Dispense based on patient and insurance preference. Use up to four times daily as directed. (FOR ICD-9 250.00, 250.01). 1 each 0  . Blood Glucose Monitoring Suppl (TRUE METRIX AIR GLUCOSE METER) w/Device KIT 1 kit by Does not apply route 4 (four) times daily -  before meals and at bedtime. 1 kit 0  . ferrous sulfate 325 (65 FE) MG tablet Take 1 tablet (325 mg total) by mouth daily with breakfast. 90 tablet 1  . gabapentin (NEURONTIN) 100 MG capsule Take one pill in the morning, one in the afternoon and two at bedtime 120 capsule 6  . glucose blood (TRUE METRIX BLOOD GLUCOSE TEST) test strip Use as instructed to check  morning blood sugar before eating, 2 hours after largest meal of the day and before bedtime 100 each 12  . insulin glargine (LANTUS) 100 UNIT/ML injection Inject 0.28 mLs (28 Units total) into the skin daily. 10 mL 6  . Insulin Syringes, Disposable, U-100 0.3 ML MISC Use daily for lantus injections 100 each 3  . Lancets (ACCU-CHEK MULTICLIX) lancets Use as directed 100 each 5  . lisinopril (ZESTRIL) 2.5 MG tablet Take 1 tablet (2.5 mg total) by mouth daily. 30 tablet 6  . olopatadine (PATANOL) 0.1 % ophthalmic solution Place 1 drop into the right eye 2 (two) times daily. 5 mL 12  . rosuvastatin (CRESTOR) 10 MG tablet Take 1 tablet (10 mg total) by mouth daily. 30 tablet 6  . TRUEplus Lancets 28G MISC Use to check blood sugars 100 each 11   No facility-administered medications prior to visit.     ROS Review of Systems  Constitutional: Negative for activity change, appetite change and fatigue.  HENT: Negative for congestion, sinus pressure and sore throat.   Eyes: Negative for visual disturbance.  Respiratory: Negative for cough, chest tightness, shortness of breath and wheezing.   Cardiovascular: Negative for chest pain and palpitations.  Gastrointestinal: Negative for abdominal distention, abdominal pain and constipation.  Endocrine: Negative for polydipsia.  Genitourinary: Negative for dysuria and frequency.  Musculoskeletal: Negative for arthralgias and back pain.  Skin: Negative for rash.  Neurological: Negative for tremors,  light-headedness and numbness.  Hematological: Does not bruise/bleed easily.  Psychiatric/Behavioral: Negative for agitation and behavioral problems.    Objective:  BP (!) 160/84   Pulse 64   Ht 5' (1.524 m)   Wt 142 lb 6.4 oz (64.6 kg)   SpO2 98%   BMI 27.81 kg/m   BP/Weight 10/04/2020 03/09/2020 1/76/1607  Systolic BP 371 062 694  Diastolic BP 84 87 80  Wt. (Lbs) 142.4 134 110.6  BMI 27.81 26.17 21.6      Physical Exam Constitutional:       Appearance: She is well-developed.  Neck:     Vascular: No JVD.  Cardiovascular:     Rate and Rhythm: Normal rate.     Heart sounds: Normal heart sounds. No murmur heard.   Pulmonary:     Effort: Pulmonary effort is normal.     Breath sounds: Normal breath sounds. No wheezing or rales.  Chest:     Chest wall: No tenderness.  Abdominal:     General: Bowel sounds are normal. There is no distension.     Palpations: Abdomen is soft. There is no mass.     Tenderness: There is no abdominal tenderness.  Musculoskeletal:        General: Normal range of motion.     Right lower leg: No edema.     Left lower leg: No edema.  Neurological:     Mental Status: She is alert and oriented to person, place, and time.  Psychiatric:        Mood and Affect: Mood normal.     CMP Latest Ref Rng & Units 03/10/2020 10/18/2019 07/16/2019  Glucose 65 - 99 mg/dL 222(H) 207(H) 191(H)  BUN 6 - 24 mg/dL 16 28(H) 24  Creatinine 0.57 - 1.00 mg/dL 0.99 1.17(H) 1.07(H)  Sodium 134 - 144 mmol/L 141 138 142  Potassium 3.5 - 5.2 mmol/L 4.0 4.6 3.8  Chloride 96 - 106 mmol/L 103 99 102  CO2 20 - 29 mmol/L 23 24 25   Calcium 8.7 - 10.2 mg/dL 9.4 9.1 8.8  Total Protein 6.0 - 8.5 g/dL - 6.8 6.6  Total Bilirubin 0.0 - 1.2 mg/dL - <0.2 0.3  Alkaline Phos 39 - 117 IU/L - 131(H) 114  AST 0 - 40 IU/L - 15 19  ALT 0 - 32 IU/L - 16 16    Lipid Panel     Component Value Date/Time   CHOL 144 06/07/2020 1559   TRIG 96 06/07/2020 1559   HDL 51 06/07/2020 1559   CHOLHDL 2.8 06/07/2020 1559   LDLCALC 75 06/07/2020 1559    CBC    Component Value Date/Time   WBC 9.7 07/16/2019 1343   WBC 13.1 (H) 09/22/2018 0508   RBC 4.60 07/16/2019 1343   RBC 4.35 09/22/2018 0508   HGB 13.0 07/16/2019 1343   HCT 39.3 07/16/2019 1343   PLT 213 07/16/2019 1343   MCV 85 07/16/2019 1343   MCH 28.3 07/16/2019 1343   MCH 26.0 09/22/2018 0508   MCHC 33.1 07/16/2019 1343   MCHC 31.3 09/22/2018 0508   RDW 12.5 07/16/2019 1343    LYMPHSABS 3.2 (H) 07/09/2019 1341   MONOABS 1.0 09/22/2018 0508   EOSABS 0.3 07/09/2019 1341   BASOSABS 0.1 07/09/2019 1341    Lab Results  Component Value Date   HGBA1C 7.7 (A) 10/04/2020    Assessment & Plan:  1. Type 2 diabetes mellitus with diabetic polyneuropathy, with long-term current use of insulin (HCC) Suboptimally controlled with A1c of  7.7 which is up from 7.1 previously; goal is less than 7.0 She would like to work on lifestyle and increase her exercise regimen hence I will make no changes today but will reassess at her next visit Counseled on Diabetic diet, my plate method, 573 minutes of moderate intensity exercise/week Blood sugar logs with fasting goals of 80-120 mg/dl, random of less than 180 and in the event of sugars less than 60 mg/dl or greater than 400 mg/dl encouraged to notify the clinic. Advised on the need for annual eye exams, annual foot exams, Pneumonia vaccine. - POCT glucose (manual entry) - POCT glycosylated hemoglobin (Hb A1C) - insulin glargine (LANTUS) 100 UNIT/ML injection; Inject 0.28 mLs (28 Units total) into the skin daily.  Dispense: 10 mL; Refill: 6 - rosuvastatin (CRESTOR) 10 MG tablet; Take 1 tablet (10 mg total) by mouth daily.  Dispense: 30 tablet; Refill: 6 - gabapentin (NEURONTIN) 100 MG capsule; Take one pill in the morning, one in the afternoon and two at bedtime  Dispense: 120 capsule; Refill: 6  2. Microalbuminuria due to type 2 diabetes mellitus ([place]) Due to diabetes mellitus - lisinopril (ZESTRIL) 5 MG tablet; Take 1 tablet (5 mg total) by mouth daily.  Dispense: 90 tablet; Refill: 1  3. Hypertension associated with diabetes ([place]) Increased dose of lisinopril We will check potassium level at next visit - Basic Metabolic Panel; Future    No orders of the defined types were placed in this encounter.   Follow-up: Return in about 1 month (around 11/01/2020) for Complete physical exam.       Charlott Rakes, MD, FAAFP. [facility] and [place], [place]   10/04/2020, 4:35 PM

## 2020-10-05 MED FILL — LISINOPRIL 5 MG TABLET: 5 | 30 days supply | Qty: 30 | Fill #0

## 2020-10-05 MED FILL — $LANTUS 100 UNITS/ML VIAL: 100 | 28 days supply | Qty: 10 | Fill #0

## 2020-10-11 ENCOUNTER — Ambulatory Visit: Payer: Self-pay | Attending: Family Medicine

## 2020-10-11 ENCOUNTER — Other Ambulatory Visit: Payer: Self-pay

## 2020-10-11 DIAGNOSIS — E1159 Type 2 diabetes mellitus with other circulatory complications: Secondary | ICD-10-CM

## 2020-10-11 DIAGNOSIS — I152 Hypertension secondary to endocrine disorders: Secondary | ICD-10-CM

## 2020-10-12 LAB — BASIC METABOLIC PANEL
BUN/Creatinine Ratio: 20 (ref 9–23)
BUN: 21 mg/dL (ref 6–24)
CO2: 21 mmol/L (ref 20–29)
Calcium: 9 mg/dL (ref 8.7–10.2)
Chloride: 102 mmol/L (ref 96–106)
Creatinine, Ser: 1.03 mg/dL — ABNORMAL HIGH (ref 0.57–1.00)
GFR calc Af Amer: 69 mL/min/{1.73_m2} (ref >59)
GFR calc non Af Amer: 60 mL/min/{1.73_m2} (ref >59)
Glucose: 163 mg/dL — ABNORMAL HIGH (ref 65–99)
Potassium: 4.2 mmol/L (ref 3.5–5.2)
Sodium: 141 mmol/L (ref 134–144)

## 2020-10-20 ENCOUNTER — Telehealth: Payer: Self-pay

## 2020-10-20 NOTE — Telephone Encounter (Signed)
-----   Message from Hoy Register, MD sent at 10/12/2020 11:17 AM EST ----- Labs are stable

## 2020-10-20 NOTE — Telephone Encounter (Signed)
Patient was called and a voicemail was left informing patient to return phone call for lab results. 

## 2020-11-07 ENCOUNTER — Ambulatory Visit: Payer: Self-pay | Attending: Family Medicine | Admitting: Family Medicine

## 2020-11-07 ENCOUNTER — Other Ambulatory Visit: Payer: Self-pay

## 2020-11-07 ENCOUNTER — Other Ambulatory Visit: Payer: Self-pay | Admitting: Family Medicine

## 2020-11-07 ENCOUNTER — Encounter: Payer: Self-pay | Admitting: Family Medicine

## 2020-11-07 VITALS — BP 109/74 | HR 66 | Ht 60.0 in | Wt 139.8 lb

## 2020-11-07 DIAGNOSIS — Z1231 Encounter for screening mammogram for malignant neoplasm of breast: Secondary | ICD-10-CM

## 2020-11-07 DIAGNOSIS — Z124 Encounter for screening for malignant neoplasm of cervix: Secondary | ICD-10-CM

## 2020-11-07 DIAGNOSIS — Z794 Long term (current) use of insulin: Secondary | ICD-10-CM

## 2020-11-07 DIAGNOSIS — Z0000 Encounter for general adult medical examination without abnormal findings: Secondary | ICD-10-CM

## 2020-11-07 DIAGNOSIS — Z1211 Encounter for screening for malignant neoplasm of colon: Secondary | ICD-10-CM

## 2020-11-07 NOTE — Telephone Encounter (Signed)
Requested Prescriptions  Pending Prescriptions Disp Refills  . TRUEplus Lancets 28G MISC [Pharmacy Med Name: TRUEplus LANCETS 28G MISC Miscellaneous] 100 each 11    Sig: USE TO CHECK BLOOD SUGARS     Endocrinology: Diabetes - Testing Supplies Passed - 11/07/2020  2:22 PM      Passed - Valid encounter within last 12 months    Recent Outpatient Visits          1 month ago Type 2 diabetes mellitus with diabetic polyneuropathy, with long-term current use of insulin (HCC)   [facility] [place], [place], MD   4 months ago Microalbuminuria due to type 2 diabetes mellitus (HCC)   [facility] [place], [place], MD   5 months ago Type 2 diabetes mellitus with diabetic polyneuropathy, with long-term current use of insulin (HCC)   [place] [facility] [place], Jeannett Senior L, RPH-CPP   6 months ago Type 2 diabetes mellitus with diabetic polyneuropathy, with long-term current use of insulin [facility])   [place] [facility] [place], Jeannett Senior L, RPH-CPP   6 months ago Type 2 diabetes mellitus with diabetic polyneuropathy, with long-term current use of insulin [facility])   [place] [place]is [place]ux[place], Cornelius Moras, RPH-CPP      Future Appointments            Today Hoy Register, MD [facility]           . TRUE METRIX BLOOD GLUCOSE TEST test strip [Pharmacy Med Name: TRUE METRIX GLUCOSE TEST ST Strip] 100 strip 12    Sig: USE AS INSTRUCTED TO CHECK MORNING BLOOD SUGAR BEFORE EATING, 2 HOURS AFTER LARGEST MEAL OF THE DAY AND BEFORE BEDTIME     Endocrinology: Diabetes - Testing Supplies Passed - 11/07/2020  2:22 PM      Passed - Valid encounter within last 12 months    Recent Outpatient Visits          1 month ago Type 2 diabetes mellitus with diabetic polyneuropathy, with long-term current use of insulin (HCC)   [place] [facility]  [place], [place], MD   4 months ago Microalbuminuria due to type 2 diabetes mellitus (HCC)   [facility] [place], [place], MD   5 months ago Type 2 diabetes mellitus with diabetic polyneuropathy, with long-term current use of insulin (HCC)   [place] [facility] [place], Jeannett Senior L, RPH-CPP   6 months ago Type 2 diabetes mellitus with diabetic polyneuropathy, with long-term current use of insulin [facility])   [place] [facility] [place], Jeannett Senior L, RPH-CPP   6 months ago Type 2 diabetes mellitus with diabetic polyneuropathy, with long-term current use of insulin [facility])   [place] [facility] Lois Huxley, Cornelius Moras, RPH-CPP      Future Appointments            Today Hoy Register, MD [facility]

## 2020-11-07 NOTE — Progress Notes (Signed)
Physical and Pap smear.

## 2020-11-07 NOTE — Progress Notes (Signed)
Subjective:  Patient ID: Nancy Stephenson, female    DOB: [birth year]  Age: 60 y.o. MRN: [id number]  CC: Annual Exam and Gynecologic Exam   HPI Nancy Stephenson  is a 60 year old female patient with a history of type 2 diabetes mellitus with microalbuminuria (A1c 7.7) who presents today for complete physical exam. Underwent catarct surgery on 10/20/20.  She is due for Pap smear, mammogram and colonoscopy. Past Medical History:  Diagnosis Date  . Diabetes mellitus without complication ([place])   . Pre-diabetes     Past Surgical History:  Procedure Laterality Date  . CESAREAN SECTION      Family History  Problem Relation Age of Onset  . Hypertension Sister   . Hypertension Brother     Allergies  Allergen Reactions  . Fish Allergy Shortness Of Breath, Itching and Swelling  . Shellfish-Derived Products Shortness Of Breath, Itching and Swelling    Outpatient Medications Prior to Visit  Medication Sig Dispense Refill  . blood glucose meter kit and supplies KIT Dispense based on patient and insurance preference. Use up to four times daily as directed. (FOR ICD-9 250.00, 250.01). 1 each 0  . Blood Glucose Monitoring Suppl (TRUE METRIX AIR GLUCOSE METER) w/Device KIT 1 kit by Does not apply route 4 (four) times daily -  before meals and at bedtime. 1 kit 0  . ferrous sulfate 325 (65 FE) MG tablet Take 1 tablet (325 mg total) by mouth daily with breakfast. 90 tablet 1  . gabapentin (NEURONTIN) 100 MG capsule Take one pill in the morning, one in the afternoon and two at bedtime 120 capsule 6  . insulin glargine (LANTUS) 100 UNIT/ML injection Inject 0.28 mLs (28 Units total) into the skin daily. 10 mL 6  . Insulin Syringes, Disposable, U-100 0.3 ML MISC Use daily for lantus injections 100 each 3  . Lancets (ACCU-CHEK MULTICLIX) lancets Use as directed 100 each 5  . lisinopril (ZESTRIL) 5 MG tablet Take 1 tablet (5 mg total) by mouth daily. 90 tablet 1  . olopatadine (PATANOL)  0.1 % ophthalmic solution Place 1 drop into the right eye 2 (two) times daily. 5 mL 12  . rosuvastatin (CRESTOR) 10 MG tablet Take 1 tablet (10 mg total) by mouth daily. 30 tablet 6   No facility-administered medications prior to visit.     ROS Review of Systems  Constitutional: Negative for activity change, appetite change and fatigue.  HENT: Negative for congestion, sinus pressure and sore throat.   Eyes: Negative for visual disturbance.  Respiratory: Negative for cough, chest tightness, shortness of breath and wheezing.   Cardiovascular: Negative for chest pain and palpitations.  Gastrointestinal: Negative for abdominal distention, abdominal pain and constipation.  Endocrine: Negative for polydipsia.  Genitourinary: Negative for dysuria and frequency.  Musculoskeletal: Negative for arthralgias and back pain.  Skin: Negative for rash.  Neurological: Negative for tremors, light-headedness and numbness.  Hematological: Does not bruise/bleed easily.  Psychiatric/Behavioral: Negative for agitation and behavioral problems.    Objective:  BP 109/74   Pulse 66   Ht 5' (1.524 m)   Wt 139 lb 12.8 oz (63.4 kg)   SpO2 98%   BMI 27.30 kg/m   BP/Weight 11/07/2020 10/04/2020 12/02/8117  Systolic BP 147 829 562  Diastolic BP 74 84 87  Wt. (Lbs) 139.8 142.4 134  BMI 27.3 27.81 26.17      Physical Exam Constitutional:      General: She is not in acute distress.  Appearance: She is well-developed. She is not diaphoretic.  HENT:     Head: Normocephalic.     Right Ear: External ear normal.     Left Ear: External ear normal.     Nose: Nose normal.  Eyes:     Conjunctiva/sclera: Conjunctivae normal.     Pupils: Pupils are equal, round, and reactive to light.  Neck:     Vascular: No JVD.  Cardiovascular:     Rate and Rhythm: Normal rate and regular rhythm.     Heart sounds: Normal heart sounds. No murmur heard. No gallop.   Pulmonary:     Effort: Pulmonary effort is normal. No  respiratory distress.     Breath sounds: Normal breath sounds. No wheezing or rales.  Chest:     Chest wall: No tenderness.  Breasts:     Right: No mass or tenderness.     Left: No mass or tenderness.    Abdominal:     General: Bowel sounds are normal. There is no distension.     Palpations: Abdomen is soft. There is no mass.     Tenderness: There is no abdominal tenderness.  Genitourinary:    Comments: External genitalia, vagina, cervix-normal Musculoskeletal:        General: No tenderness. Normal range of motion.     Cervical back: Normal range of motion.  Skin:    General: Skin is warm and dry.  Neurological:     Mental Status: She is alert and oriented to person, place, and time.     Deep Tendon Reflexes: Reflexes are normal and symmetric.     CMP Latest Ref Rng & Units 10/11/2020 03/10/2020 10/18/2019  Glucose 65 - 99 mg/dL 163(H) 222(H) 207(H)  BUN 6 - 24 mg/dL 21 16 28(H)  Creatinine 0.57 - 1.00 mg/dL 1.03(H) 0.99 1.17(H)  Sodium 134 - 144 mmol/L 141 141 138  Potassium 3.5 - 5.2 mmol/L 4.2 4.0 4.6  Chloride 96 - 106 mmol/L 102 103 99  CO2 20 - 29 mmol/L 21 23 24   Calcium 8.7 - 10.2 mg/dL 9.0 9.4 9.1  Total Protein 6.0 - 8.5 g/dL - - 6.8  Total Bilirubin 0.0 - 1.2 mg/dL - - <0.2  Alkaline Phos 39 - 117 IU/L - - 131(H)  AST 0 - 40 IU/L - - 15  ALT 0 - 32 IU/L - - 16    Lipid Panel     Component Value Date/Time   CHOL 144 06/07/2020 1559   TRIG 96 06/07/2020 1559   HDL 51 06/07/2020 1559   CHOLHDL 2.8 06/07/2020 1559   LDLCALC 75 06/07/2020 1559    CBC    Component Value Date/Time   WBC 9.7 07/16/2019 1343   WBC 13.1 (H) 09/22/2018 0508   RBC 4.60 07/16/2019 1343   RBC 4.35 09/22/2018 0508   HGB 13.0 07/16/2019 1343   HCT 39.3 07/16/2019 1343   PLT 213 07/16/2019 1343   MCV 85 07/16/2019 1343   MCH 28.3 07/16/2019 1343   MCH 26.0 09/22/2018 0508   MCHC 33.1 07/16/2019 1343   MCHC 31.3 09/22/2018 0508   RDW 12.5 07/16/2019 1343   LYMPHSABS 3.2 (H)  07/09/2019 1341   MONOABS 1.0 09/22/2018 0508   EOSABS 0.3 07/09/2019 1341   BASOSABS 0.1 07/09/2019 1341    Lab Results  Component Value Date   HGBA1C 7.7 (A) 10/04/2020    Assessment & Plan:  1. Annual physical exam Counseled on 150 minutes of exercise per week, healthy eating (  including decreased daily intake of saturated fats, cholesterol, added sugars, sodium), routine healthcare maintenance. - Basic Metabolic Panel  2. Screening for colon cancer - Fecal occult blood, imunochemical([facility])  3. Screening for cervical cancer - Cytology - PAP([facility])  4. Encounter for screening mammogram for malignant neoplasm of breast - MM 3D SCREEN BREAST BILATERAL; Future   No orders of the defined types were placed in this encounter.   Follow-up: Return in about 3 months (around 02/07/2021) for medical conditions.       Charlott Rakes, MD, FAAFP. [facility] [place], [place]   11/07/2020, 3:22 PM

## 2020-11-07 NOTE — Patient Instructions (Signed)
Mantenimiento de la salud en las mujeres Health Maintenance, Female Adoptar un estilo de vida saludable y recibir atencin preventiva son importantes para promover la salud y el bienestar. Consulte al mdico sobre:  El esquema adecuado para hacerse pruebas y exmenes peridicos.  Cosas que puede hacer por su cuenta para prevenir enfermedades y mantenerse sana. Qu debo saber sobre la dieta, el peso y el ejercicio? Consuma una dieta saludable  Consuma una dieta que incluya muchas verduras, frutas, productos lcteos con bajo contenido de grasa y protenas magras.  No consuma muchos alimentos ricos en grasas slidas, azcares agregados o sodio.   Mantenga un peso saludable El ndice de masa muscular (IMC) se utiliza para identificar problemas de peso. Proporciona una estimacin de la grasa corporal basndose en el peso y la altura. Su mdico puede ayudarle a determinar su IMC y a lograr o mantener un peso saludable. Haga ejercicio con regularidad Haga ejercicio con regularidad. Esta es una de las prcticas ms importantes que puede hacer por su salud. La mayora de los adultos deben seguir estas pautas:  Realizar, al menos, 150minutos de actividad fsica por semana. El ejercicio debe aumentar la frecuencia cardaca y hacerlo transpirar (ejercicio de intensidad moderada).  Hacer ejercicios de fortalecimiento por lo menos dos veces por semana. Agregue esto a su plan de ejercicio de intensidad moderada.  Pasar menos tiempo sentados. Incluso la actividad fsica ligera puede ser beneficiosa. Controle sus niveles de colesterol y lpidos en la sangre Comience a realizarse anlisis de lpidos y colesterol en la sangre a los 20aos y luego reptalos cada 5aos. Hgase controlar los niveles de colesterol con mayor frecuencia si:  Sus niveles de lpidos y colesterol son altos.  Es mayor de 40aos.  Presenta un alto riesgo de padecer enfermedades cardacas. Qu debo saber sobre las pruebas de  deteccin del cncer? Segn su historia clnica y sus antecedentes familiares, es posible que deba realizarse pruebas de deteccin del cncer en diferentes edades. Esto puede incluir pruebas de deteccin de lo siguiente:  Cncer de mama.  Cncer de cuello uterino.  Cncer colorrectal.  Cncer de piel.  Cncer de pulmn. Qu debo saber sobre la enfermedad cardaca, la diabetes y la hipertensin arterial? Presin arterial y enfermedad cardaca  La hipertensin arterial causa enfermedades cardacas y aumenta el riesgo de accidente cerebrovascular. Es ms probable que esto se manifieste en las personas que tienen lecturas de presin arterial alta, tienen ascendencia africana o tienen sobrepeso.  Hgase controlar la presin arterial: ? Cada 3 a 5 aos si tiene entre 18 y 39 aos. ? Todos los aos si es mayor de 40aos. Diabetes Realcese exmenes de deteccin de la diabetes con regularidad. Este anlisis revisa el nivel de azcar en la sangre en ayunas. Hgase las pruebas de deteccin:  Cada tresaos despus de los 40aos de edad si tiene un peso normal y un bajo riesgo de padecer diabetes.  Con ms frecuencia y a partir de una edad inferior si tiene sobrepeso o un alto riesgo de padecer diabetes. Qu debo saber sobre la prevencin de infecciones? Hepatitis B Si tiene un riesgo ms alto de contraer hepatitis B, debe someterse a un examen de deteccin de este virus. Hable con el mdico para averiguar si tiene riesgo de contraer la infeccin por hepatitis B. Hepatitis C Se recomienda el anlisis a:  Todos los que nacieron entre 1945 y 1965.  Todas las personas que tengan un riesgo de haber contrado hepatitis C. Enfermedades de transmisin sexual (ETS)  Hgase   las pruebas de deteccin de ITS, incluidas la gonorrea y la clamidia, si: ? Es sexualmente activa y es menor de 24aos. ? Es mayor de 24aos, y el mdico le informa que corre riesgo de tener este tipo de  infecciones. ? La actividad sexual ha cambiado desde que le hicieron la ltima prueba de deteccin y tiene un riesgo mayor de tener clamidia o gonorrea. Pregntele al mdico si usted tiene riesgo.  Pregntele al mdico si usted tiene un alto riesgo de contraer VIH. El mdico tambin puede recomendarle un medicamento recetado para ayudar a evitar la infeccin por el VIH. Si elige tomar medicamentos para prevenir el VIH, primero debe hacerse los anlisis de deteccin del VIH. Luego debe hacerse anlisis cada 3meses mientras est tomando los medicamentos. Embarazo  Si est por dejar de menstruar (fase premenopusica) y usted puede quedar embarazada, busque asesoramiento antes de quedar embarazada.  Tome de 400 a 800microgramos (mcg) de cido flico todos los das si queda embarazada.  Pida mtodos de control de la natalidad (anticonceptivos) si desea evitar un embarazo no deseado. Osteoporosis y menopausia La osteoporosis es una enfermedad en la que los huesos pierden los minerales y la fuerza por el avance de la edad. El resultado pueden ser fracturas en los huesos. Si tiene 65aos o ms, o si est en riesgo de sufrir osteoporosis y fracturas, pregunte a su mdico si debe:  Hacerse pruebas de deteccin de prdida sea.  Tomar un suplemento de calcio o de vitamina D para reducir el riesgo de fracturas.  Recibir terapia de reemplazo hormonal (TRH) para tratar los sntomas de la menopausia. Siga estas instrucciones en su casa: Estilo de vida  No consuma ningn producto que contenga nicotina o tabaco, como cigarrillos, cigarrillos electrnicos y tabaco de mascar. Si necesita ayuda para dejar de fumar, consulte al mdico.  No consuma drogas.  No comparta agujas.  Solicite ayuda a su mdico si necesita apoyo o informacin para abandonar las drogas. Consumo de alcohol  No beba alcohol si: ? Su mdico le indica no hacerlo. ? Est embarazada, puede estar embarazada o est tratando de quedar  embarazada.  Si bebe alcohol: ? Limite la cantidad que consume de 0 a 1 medida por da. ? Limite la ingesta si est amamantando.  Est atento a la cantidad de alcohol que hay en las bebidas que toma. En [place], una medida equivale a una botella de cerveza de 12oz (355ml), un vaso de vino de 5oz (148ml) o un vaso de una bebida alcohlica de alta graduacin de 1oz (44ml). Instrucciones generales  Realcese los estudios de rutina de la salud, dentales y de la vista.  Mantngase al da con las vacunas.  Infrmele a su mdico si: ? Se siente deprimida con frecuencia. ? Alguna vez ha sido vctima de maltrato o no se siente segura en su casa. Resumen  Adoptar un estilo de vida saludable y recibir atencin preventiva son importantes para promover la salud y el bienestar.  Siga las instrucciones del mdico acerca de una dieta saludable, el ejercicio y la realizacin de pruebas o exmenes para detectar enfermedades.  Siga las instrucciones del mdico con respecto al control del colesterol y la presin arterial. Esta informacin no tiene como fin reemplazar el consejo del mdico. Asegrese de hacerle al mdico cualquier pregunta que tenga. Document Revised: 09/02/2018 Document Reviewed: 09/02/2018 Elsevier Patient Education  2021 Elsevier Inc.  

## 2020-11-08 LAB — BASIC METABOLIC PANEL
BUN/Creatinine Ratio: 16 (ref 9–23)
BUN: 19 mg/dL (ref 6–24)
CO2: 22 mmol/L (ref 20–29)
Calcium: 9.3 mg/dL (ref 8.7–10.2)
Chloride: 105 mmol/L (ref 96–106)
Creatinine, Ser: 1.18 mg/dL — ABNORMAL HIGH (ref 0.57–1.00)
Glucose: 83 mg/dL (ref 65–99)
Potassium: 4.5 mmol/L (ref 3.5–5.2)
Sodium: 142 mmol/L (ref 134–144)
eGFR: 53 mL/min/{1.73_m2} — ABNORMAL LOW (ref >59)

## 2020-11-09 LAB — CYTOLOGY - PAP
Comment: NEGATIVE
Diagnosis: NEGATIVE
High risk HPV: NEGATIVE

## 2020-11-10 ENCOUNTER — Telehealth: Payer: Self-pay

## 2020-11-10 NOTE — Telephone Encounter (Signed)
-----   Message from Enobong Newlin, MD sent at 11/08/2020  8:08 AM EDT ----- Please inform the patient that labs are normal. Thank you. 

## 2020-11-10 NOTE — Telephone Encounter (Signed)
Patient name and DOB has been verified Patient was informed of lab results. Patient had no questions.  

## 2020-11-10 NOTE — Telephone Encounter (Signed)
-----   Message from Hoy Register, MD sent at 11/09/2020  2:13 PM EDT ----- Pap smear is negative for malignancy

## 2020-11-25 ENCOUNTER — Other Ambulatory Visit: Payer: Self-pay

## 2020-12-18 ENCOUNTER — Other Ambulatory Visit: Payer: Self-pay

## 2020-12-18 MED FILL — Gabapentin Cap 100 MG: ORAL | 30 days supply | Qty: 120 | Fill #0 | Status: AC

## 2020-12-18 MED FILL — Olopatadine HCl Ophth Soln 0.1% (Base Equivalent): OPHTHALMIC | 37 days supply | Qty: 5 | Fill #0 | Status: AC

## 2020-12-18 MED FILL — Rosuvastatin Calcium Tab 10 MG: ORAL | 30 days supply | Qty: 30 | Fill #0 | Status: AC

## 2020-12-18 MED FILL — Lisinopril Tab 5 MG: ORAL | 30 days supply | Qty: 30 | Fill #0 | Status: AC

## 2020-12-18 MED FILL — Glucose Blood Test Strip: 25 days supply | Qty: 100 | Fill #0 | Status: AC

## 2021-02-07 ENCOUNTER — Ambulatory Visit: Payer: Self-pay | Admitting: Family Medicine

## 2021-03-01 ENCOUNTER — Other Ambulatory Visit: Payer: Self-pay

## 2021-03-01 MED FILL — Glucose Blood Test Strip: 25 days supply | Qty: 100 | Fill #1 | Status: AC

## 2021-03-01 MED FILL — Lisinopril Tab 5 MG: ORAL | 30 days supply | Qty: 30 | Fill #1 | Status: AC

## 2021-03-01 MED FILL — Gabapentin Cap 100 MG: ORAL | 30 days supply | Qty: 120 | Fill #0 | Status: AC

## 2021-03-01 MED FILL — Rosuvastatin Calcium Tab 10 MG: ORAL | 30 days supply | Qty: 30 | Fill #1 | Status: AC

## 2021-03-01 MED FILL — Lancets: 25 days supply | Qty: 100 | Fill #0 | Status: AC

## 2021-04-02 ENCOUNTER — Ambulatory Visit: Payer: Self-pay | Attending: Family Medicine | Admitting: Family Medicine

## 2021-04-02 ENCOUNTER — Other Ambulatory Visit: Payer: Self-pay

## 2021-04-02 ENCOUNTER — Other Ambulatory Visit: Payer: Self-pay | Admitting: Family Medicine

## 2021-04-02 ENCOUNTER — Encounter: Payer: Self-pay | Admitting: Family Medicine

## 2021-04-02 VITALS — BP 122/75 | HR 63 | Ht 60.0 in | Wt 139.0 lb

## 2021-04-02 DIAGNOSIS — Z794 Long term (current) use of insulin: Secondary | ICD-10-CM

## 2021-04-02 DIAGNOSIS — Z1211 Encounter for screening for malignant neoplasm of colon: Secondary | ICD-10-CM

## 2021-04-02 DIAGNOSIS — E1142 Type 2 diabetes mellitus with diabetic polyneuropathy: Secondary | ICD-10-CM

## 2021-04-02 DIAGNOSIS — I152 Hypertension secondary to endocrine disorders: Secondary | ICD-10-CM

## 2021-04-02 DIAGNOSIS — E1159 Type 2 diabetes mellitus with other circulatory complications: Secondary | ICD-10-CM

## 2021-04-02 DIAGNOSIS — R809 Proteinuria, unspecified: Secondary | ICD-10-CM

## 2021-04-02 DIAGNOSIS — E1129 Type 2 diabetes mellitus with other diabetic kidney complication: Secondary | ICD-10-CM

## 2021-04-02 LAB — POCT GLYCOSYLATED HEMOGLOBIN (HGB A1C): HbA1c, POC (controlled diabetic range): 7.2 % — AB (ref 0.0–7.0)

## 2021-04-02 LAB — GLUCOSE, POCT (MANUAL RESULT ENTRY): POC Glucose: 126 mg/dl — AB (ref 70–99)

## 2021-04-02 MED ORDER — LISINOPRIL 5 MG PO TABS
ORAL_TABLET | Freq: Every day | ORAL | 1 refills | Status: AC
  Filled 2021-04-02: qty 90, fill #0
  Filled 2021-05-07: qty 90, 30d supply, fill #0
  Filled 2021-06-14: qty 90, 90d supply, fill #1

## 2021-04-02 MED FILL — Lancets: 25 days supply | Qty: 100 | Fill #1 | Status: AC

## 2021-04-02 MED FILL — Rosuvastatin Calcium Tab 10 MG: ORAL | 30 days supply | Qty: 30 | Fill #2 | Status: AC

## 2021-04-02 MED FILL — Glucose Blood Test Strip: 25 days supply | Qty: 100 | Fill #2 | Status: AC

## 2021-04-02 MED FILL — Gabapentin Cap 100 MG: ORAL | 30 days supply | Qty: 120 | Fill #1 | Status: AC

## 2021-04-02 MED FILL — Lisinopril Tab 5 MG: ORAL | 30 days supply | Qty: 30 | Fill #2 | Status: AC

## 2021-04-02 NOTE — Telephone Encounter (Signed)
Requested medication (s) are due for refill today: yes  Requested medication (s) are on the active medication list: yes  Last refill:  10/11/19  Future visit scheduled: yes  Notes to clinic:  expired 10/10/20- needs new prescription   Requested Prescriptions  Pending Prescriptions Disp Refills   Insulin Syringe-Needle U-100 (TRUEPLUS INSULIN SYRINGE) 30G X 5/16" 0.5 ML MISC 100 each 3    Sig: USE DAILY FOR LANTUS INJECTIONS      There is no refill protocol information for this order

## 2021-04-02 NOTE — Progress Notes (Signed)
Subjective:  Patient ID: Nancy Stephenson, female    DOB: 03-09-1961  Age: 60 y.o. MRN: [id number]  CC: Diabetes   HPI Nancy Stephenson is a 60 year old female with a history of type 2 diabetes mellitus (A1c 7.2), hypertension.  Interval History: Underwent cataract extraction in 09/2020 Sh was provided with FIT test cards which she never returned as she had challenges with regards to transportation.  Compliant with her diabetic medication and denies presence of hypoglycemia, neuropathy is controlled on gabapentin;  she has no blurry vision.  Doing well on her antihypertensive and also on her statin. Denies acute concerns today. Past Medical History:  Diagnosis Date   Diabetes mellitus without complication ([place])    Pre-diabetes     Past Surgical History:  Procedure Laterality Date   CESAREAN SECTION      Family History  Problem Relation Age of Onset   Hypertension Sister    Hypertension Brother     Allergies  Allergen Reactions   Fish Allergy Shortness Of Breath, Itching and Swelling   Shellfish-Derived Products Shortness Of Breath, Itching and Swelling    Outpatient Medications Prior to Visit  Medication Sig Dispense Refill   blood glucose meter kit and supplies KIT Dispense based on patient and insurance preference. Use up to four times daily as directed. (FOR ICD-9 250.00, 250.01). 1 each 0   Blood Glucose Monitoring Suppl (TRUE METRIX AIR GLUCOSE METER) w/Device KIT 1 kit by Does not apply route 4 (four) times daily -  before meals and at bedtime. 1 kit 0   ferrous sulfate 325 (65 FE) MG tablet Take 1 tablet (325 mg total) by mouth daily with breakfast. 90 tablet 1   gabapentin (NEURONTIN) 100 MG capsule TAKE 1 CAPSULE BY MOUTH IN THE MORNING, 1 CAPSULE IN THE AFTERNOON AND 2 CAPSULES AT BEDTIME 120 capsule 6   gabapentin (NEURONTIN) 100 MG capsule TAKE ONE CAPSULE IN THE MORNING, ONE IN THE AFTERNOON AND TWO AT BEDTIME 120 capsule 6   glucose blood test  strip USE AS INSTRUCTED TO CHECK MORNING BLOOD SUGAR BEFORE EATING, 2 HOURS AFTER LARGEST MEAL OF THE DAY AND BEFORE BEDTIME 100 strip 12   insulin glargine (LANTUS) 100 UNIT/ML injection Inject 0.28 mLs (28 Units total) into the skin daily. 10 mL 6   Insulin Syringes, Disposable, U-100 0.3 ML MISC Use daily for lantus injections 100 each 3   Lancets (ACCU-CHEK MULTICLIX) lancets Use as directed 100 each 5   olopatadine (PATANOL) 0.1 % ophthalmic solution PLACE 1 DROP INTO THE RIGHT EYE 2 (TWO) TIMES DAILY. 5 mL 12   rosuvastatin (CRESTOR) 10 MG tablet TAKE 1 TABLET (10 MG TOTAL) BY MOUTH DAILY. 30 tablet 6   TRUEplus Lancets 28G MISC USE AS DIRECTED TO CHECK BLOOD SUGAR 100 each 11   lisinopril (ZESTRIL) 5 MG tablet TAKE 1 TABLET (5 MG TOTAL) BY MOUTH DAILY. 90 tablet 1   No facility-administered medications prior to visit.     ROS Review of Systems  Constitutional:  Negative for activity change, appetite change and fatigue.  HENT:  Negative for congestion, sinus pressure and sore throat.   Eyes:  Negative for visual disturbance.  Respiratory:  Negative for cough, chest tightness, shortness of breath and wheezing.   Cardiovascular:  Negative for chest pain and palpitations.  Gastrointestinal:  Negative for abdominal distention, abdominal pain and constipation.  Endocrine: Negative for polydipsia.  Genitourinary:  Negative for dysuria and frequency.  Musculoskeletal:  Negative for  arthralgias and back pain.  Skin:  Negative for rash.  Neurological:  Negative for tremors, light-headedness and numbness.  Hematological:  Does not bruise/bleed easily.  Psychiatric/Behavioral:  Negative for agitation and behavioral problems.    Objective:  BP 122/75   Pulse 63   Ht 5' (1.524 m)   Wt 139 lb (63 kg)   SpO2 97%   BMI 27.15 kg/m   BP/Weight 04/02/2021 4/74/2595 01/27/8755  Systolic BP 433 295 188  Diastolic BP 75 74 84  Wt. (Lbs) 139 139.8 142.4  BMI 27.15 27.3 27.81      Physical  Exam Constitutional:      Appearance: She is well-developed.  Cardiovascular:     Rate and Rhythm: Normal rate.     Heart sounds: Normal heart sounds. No murmur heard. Pulmonary:     Effort: Pulmonary effort is normal.     Breath sounds: Normal breath sounds. No wheezing or rales.  Chest:     Chest wall: No tenderness.  Abdominal:     General: Bowel sounds are normal. There is no distension.     Palpations: Abdomen is soft. There is no mass.     Tenderness: There is no abdominal tenderness.  Musculoskeletal:        General: Normal range of motion.     Right lower leg: No edema.     Left lower leg: No edema.  Neurological:     Mental Status: She is alert and oriented to person, place, and time.  Psychiatric:        Mood and Affect: Mood normal.    CMP Latest Ref Rng & Units 11/07/2020 10/11/2020 03/10/2020  Glucose 65 - 99 mg/dL 83 163(H) 222(H)  BUN 6 - 24 mg/dL 19 21 16   Creatinine 0.57 - 1.00 mg/dL 1.18(H) 1.03(H) 0.99  Sodium 134 - 144 mmol/L 142 141 141  Potassium 3.5 - 5.2 mmol/L 4.5 4.2 4.0  Chloride 96 - 106 mmol/L 105 102 103  CO2 20 - 29 mmol/L 22 21 23   Calcium 8.7 - 10.2 mg/dL 9.3 9.0 9.4  Total Protein 6.0 - 8.5 g/dL - - -  Total Bilirubin 0.0 - 1.2 mg/dL - - -  Alkaline Phos 39 - 117 IU/L - - -  AST 0 - 40 IU/L - - -  ALT 0 - 32 IU/L - - -    Lipid Panel     Component Value Date/Time   CHOL 144 06/07/2020 1559   TRIG 96 06/07/2020 1559   HDL 51 06/07/2020 1559   CHOLHDL 2.8 06/07/2020 1559   LDLCALC 75 06/07/2020 1559    CBC    Component Value Date/Time   WBC 9.7 07/16/2019 1343   WBC 13.1 (H) 09/22/2018 0508   RBC 4.60 07/16/2019 1343   RBC 4.35 09/22/2018 0508   HGB 13.0 07/16/2019 1343   HCT 39.3 07/16/2019 1343   PLT 213 07/16/2019 1343   MCV 85 07/16/2019 1343   MCH 28.3 07/16/2019 1343   MCH 26.0 09/22/2018 0508   MCHC 33.1 07/16/2019 1343   MCHC 31.3 09/22/2018 0508   RDW 12.5 07/16/2019 1343   LYMPHSABS 3.2 (H) 07/09/2019 1341    MONOABS 1.0 09/22/2018 0508   EOSABS 0.3 07/09/2019 1341   BASOSABS 0.1 07/09/2019 1341    Lab Results  Component Value Date   HGBA1C 7.2 (A) 04/02/2021    Assessment & Plan:  1. Type 2 diabetes mellitus with diabetic polyneuropathy, with long-term current use of insulin ([place]) Controlled with  A1c of 7.2 Continue current regimen Counseled on Diabetic diet, my plate method, 425 minutes of moderate intensity exercise/week Blood sugar logs with fasting goals of 80-120 mg/dl, random of less than 180 and in the event of sugars less than 60 mg/dl or greater than 400 mg/dl encouraged to notify the clinic. Advised on the need for annual eye exams, annual foot exams, Pneumonia vaccine. - POCT glucose (manual entry) - POCT glycosylated hemoglobin (Hb A1C)  2. Microalbuminuria due to type 2 diabetes mellitus ([place]) Consider addition of Farxiga at next visit - lisinopril (ZESTRIL) 5 MG tablet; TAKE 1 TABLET (5 MG TOTAL) BY MOUTH DAILY.  Dispense: 90 tablet; Refill: 1  3. Screening for colon cancer - Fecal occult blood, imunochemical([facility])  4. Hypertension associated with diabetes ([place]) Controlled Continue lisinopril   Meds ordered this encounter  Medications   lisinopril (ZESTRIL) 5 MG tablet    Sig: TAKE 1 TABLET (5 MG TOTAL) BY MOUTH DAILY.    Dispense:  90 tablet    Refill:  1    Follow-up: Return in about 6 months (around 10/03/2021) for medical conditions.       Charlott Rakes, MD, FAAFP. [facility] and [facility], [place]   04/02/2021, 6:35 PM

## 2021-04-03 ENCOUNTER — Other Ambulatory Visit: Payer: Self-pay

## 2021-04-03 MED ORDER — "INSULIN SYRINGE-NEEDLE U-100 30G X 5/16"" 0.5 ML MISC"
3 refills | Status: AC
  Filled 2021-04-03: qty 100, 100d supply, fill #0
  Filled 2021-05-07: qty 100, 25d supply, fill #0

## 2021-04-10 ENCOUNTER — Other Ambulatory Visit: Payer: Self-pay

## 2021-05-07 ENCOUNTER — Other Ambulatory Visit: Payer: Self-pay

## 2021-05-07 MED FILL — Glucose Blood Test Strip: 25 days supply | Qty: 100 | Fill #3 | Status: AC

## 2021-05-07 MED FILL — Lancets: 25 days supply | Qty: 100 | Fill #2 | Status: AC

## 2021-05-07 MED FILL — Gabapentin Cap 100 MG: ORAL | 30 days supply | Qty: 120 | Fill #1 | Status: AC

## 2021-05-07 MED FILL — Rosuvastatin Calcium Tab 10 MG: ORAL | 30 days supply | Qty: 30 | Fill #3 | Status: AC

## 2021-06-14 ENCOUNTER — Other Ambulatory Visit: Payer: Self-pay

## 2021-06-14 MED FILL — Gabapentin Cap 100 MG: ORAL | 90 days supply | Qty: 360 | Fill #2 | Status: AC

## 2021-06-14 MED FILL — Glucose Blood Test Strip: 25 days supply | Qty: 100 | Fill #4 | Status: AC

## 2021-06-14 MED FILL — Rosuvastatin Calcium Tab 10 MG: ORAL | 30 days supply | Qty: 30 | Fill #4 | Status: AC

## 2021-06-14 MED FILL — Lancets: 25 days supply | Qty: 100 | Fill #3 | Status: AC

## 2021-09-18 ENCOUNTER — Other Ambulatory Visit: Payer: Self-pay

## 2021-10-03 ENCOUNTER — Ambulatory Visit: Payer: Self-pay | Admitting: Family Medicine
